# Patient Record
Sex: Female | Born: 1950 | Race: Black or African American | Hispanic: No | Marital: Single
Health system: Southern US, Community
[De-identification: ages and names within clinical notes are randomized; demographics above are authoritative.]

---

## 2013-03-30 ENCOUNTER — Inpatient Hospital Stay: Payer: Self-pay | Admitting: Internal Medicine

## 2013-03-30 LAB — URINALYSIS, COMPLETE
Bacteria: NONE SEEN
Blood: NEGATIVE
Glucose,UR: NEGATIVE mg/dL (ref 0–75)
Ketone: NEGATIVE
Leukocyte Esterase: NEGATIVE
Nitrite: NEGATIVE
Protein: 30
RBC,UR: 3 /HPF (ref 0–5)
Squamous Epithelial: 3

## 2013-03-30 LAB — CBC
HGB: 11.9 g/dL — ABNORMAL LOW (ref 12.0–16.0)
MCH: 31.8 pg (ref 26.0–34.0)
MCV: 93 fL (ref 80–100)
RBC: 3.73 10*6/uL — ABNORMAL LOW (ref 3.80–5.20)
RDW: 16.8 % — ABNORMAL HIGH (ref 11.5–14.5)

## 2013-03-30 LAB — CK TOTAL AND CKMB (NOT AT ARMC): CK-MB: 0.8 ng/mL (ref 0.5–3.6)

## 2013-03-30 LAB — COMPREHENSIVE METABOLIC PANEL
Albumin: 4.2 g/dL (ref 3.4–5.0)
Alkaline Phosphatase: 108 U/L (ref 50–136)
Anion Gap: 7 (ref 7–16)
BUN: 20 mg/dL — ABNORMAL HIGH (ref 7–18)
Bilirubin,Total: 0.5 mg/dL (ref 0.2–1.0)
Calcium, Total: 9.2 mg/dL (ref 8.5–10.1)
Co2: 30 mmol/L (ref 21–32)
Creatinine: 2.5 mg/dL — ABNORMAL HIGH (ref 0.60–1.30)
EGFR (African American): 23 — ABNORMAL LOW
EGFR (Non-African Amer.): 20 — ABNORMAL LOW
Glucose: 102 mg/dL — ABNORMAL HIGH (ref 65–99)
SGOT(AST): 131 U/L — ABNORMAL HIGH (ref 15–37)
SGPT (ALT): 43 U/L (ref 12–78)
Total Protein: 7.4 g/dL (ref 6.4–8.2)

## 2013-03-30 LAB — TROPONIN I
Troponin-I: 0.04 ng/mL
Troponin-I: 0.03 ng/mL

## 2013-03-31 LAB — CBC WITH DIFFERENTIAL/PLATELET
Basophil %: 0.7 %
Eosinophil %: 2.2 %
HCT: 34.6 % — ABNORMAL LOW (ref 35.0–47.0)
HGB: 11.6 g/dL — ABNORMAL LOW (ref 12.0–16.0)
Lymphocyte #: 1.8 10*3/uL (ref 1.0–3.6)
Lymphocyte %: 30.9 %
MCH: 31.7 pg (ref 26.0–34.0)
MCHC: 33.5 g/dL (ref 32.0–36.0)
MCV: 95 fL (ref 80–100)
RBC: 3.66 10*6/uL — ABNORMAL LOW (ref 3.80–5.20)
RDW: 16.9 % — ABNORMAL HIGH (ref 11.5–14.5)
WBC: 5.7 10*3/uL (ref 3.6–11.0)

## 2013-03-31 LAB — BASIC METABOLIC PANEL
BUN: 17 mg/dL (ref 7–18)
Chloride: 102 mmol/L (ref 98–107)
Creatinine: 1.05 mg/dL (ref 0.60–1.30)
EGFR (African American): >60
Glucose: 100 mg/dL — ABNORMAL HIGH (ref 65–99)
Potassium: 3 mmol/L — ABNORMAL LOW (ref 3.5–5.1)
Sodium: 137 mmol/L (ref 136–145)

## 2013-03-31 LAB — MAGNESIUM: Magnesium: 1.2 mg/dL — ABNORMAL LOW

## 2013-04-01 LAB — BASIC METABOLIC PANEL
BUN: 14 mg/dL (ref 7–18)
Calcium, Total: 9.3 mg/dL (ref 8.5–10.1)
EGFR (African American): >60
EGFR (Non-African Amer.): >60
Glucose: 87 mg/dL (ref 65–99)
Osmolality: 281 (ref 275–301)
Potassium: 3.3 mmol/L — ABNORMAL LOW (ref 3.5–5.1)
Sodium: 141 mmol/L (ref 136–145)

## 2013-04-01 LAB — PROTEIN ELECTROPHORESIS(ARMC)

## 2013-09-17 IMAGING — CT CT HEAD WITHOUT CONTRAST
2 series · 16 of 30 positions shown, 20 images · non-contrast
Comparison: none

REASON FOR EXAM: unwitnessed fall
COMMENTS:

[Series 2: without · axial · non-contrast · 0.44mm/px · z∈[+1196,+1330]mm · 13 of 37 slices shown, 17 images]
[im 3/37  brain]
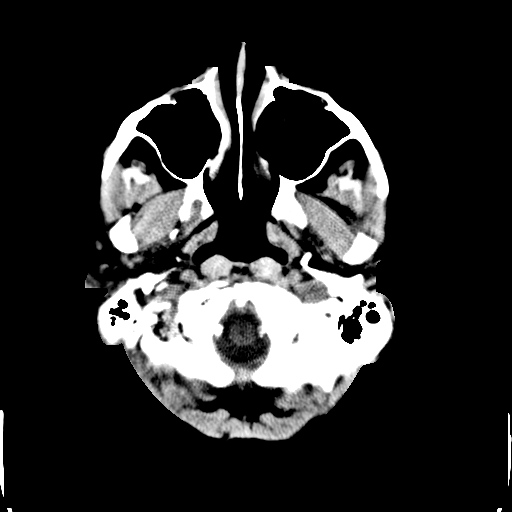
[im 3/37  bone]
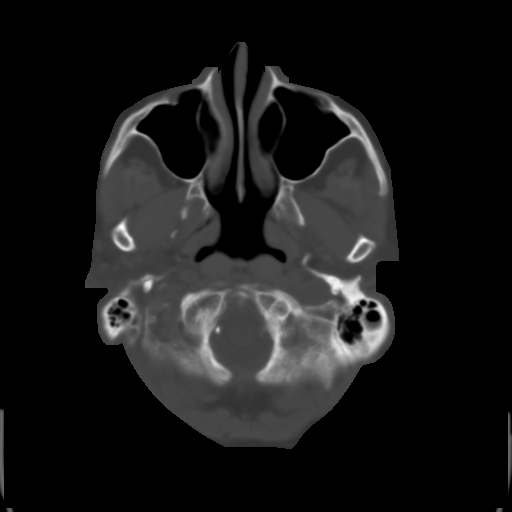
[im 6/37  brain]
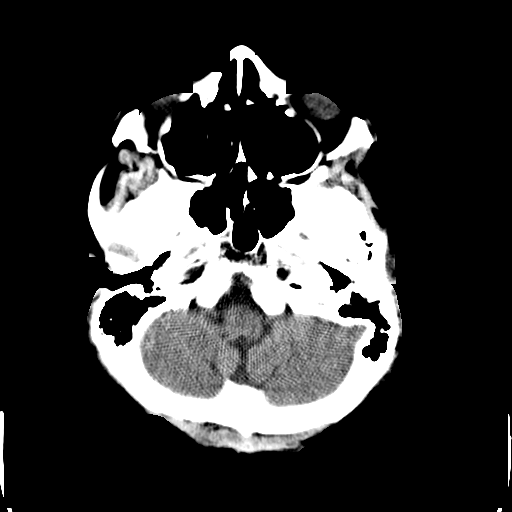
[im 8/37  brain]
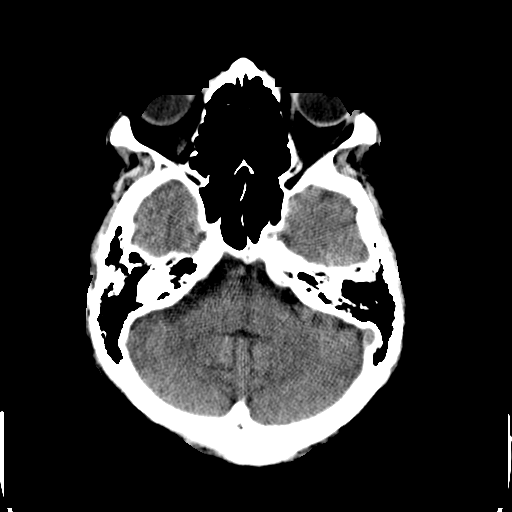
[im 11/37  brain]
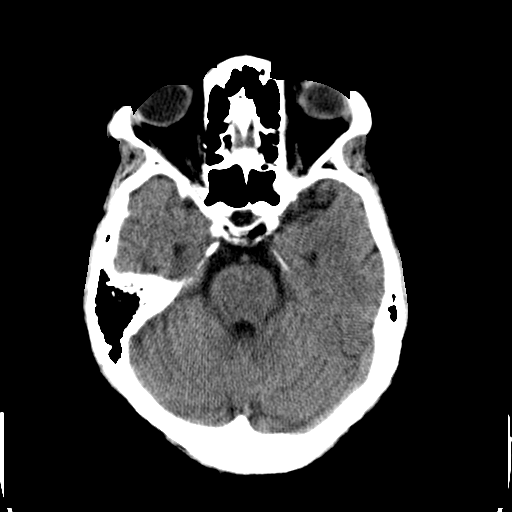
[im 13/37  brain]
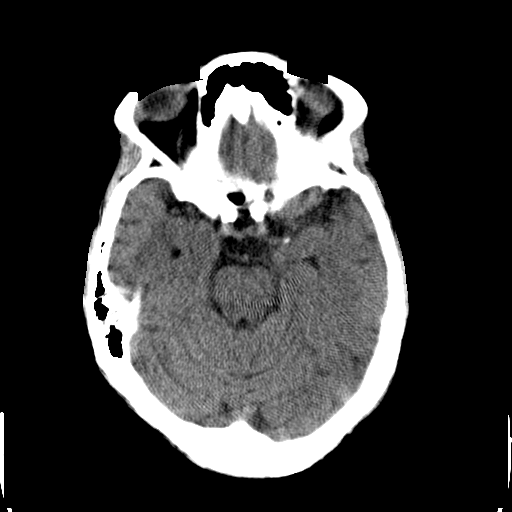
[im 13/37  bone]
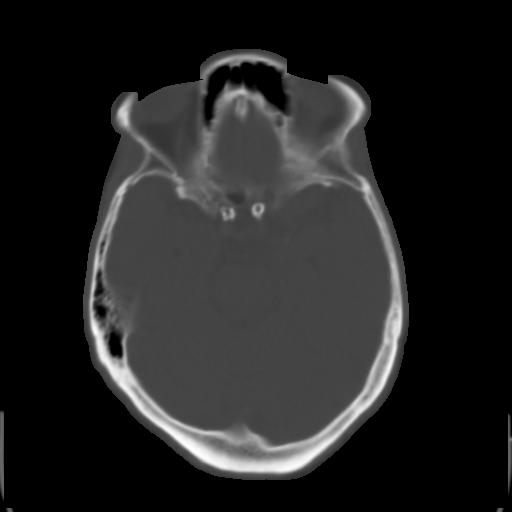
[im 16/37  brain]
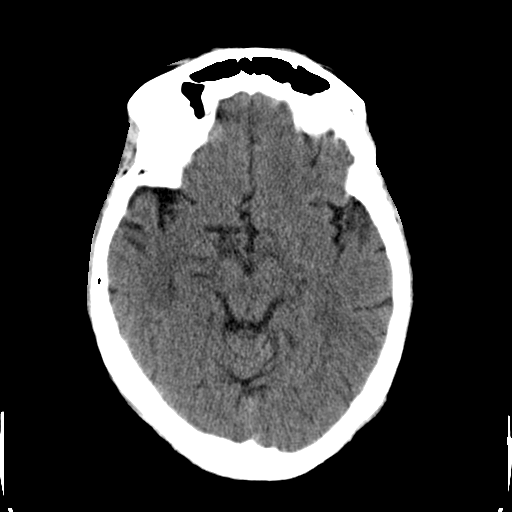
[im 19/37  brain]
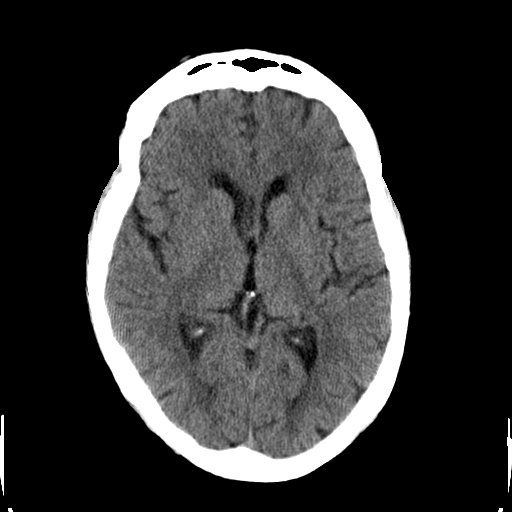
[im 21/37  brain]
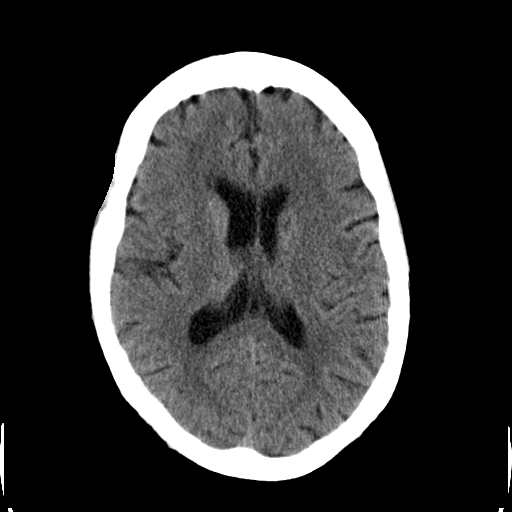
[im 24/37  brain]
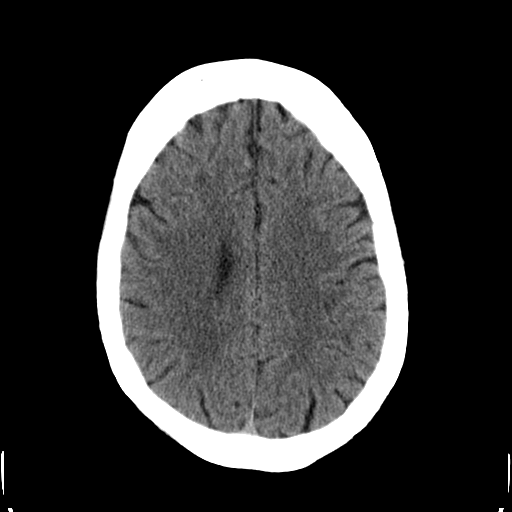
[im 24/37  bone]
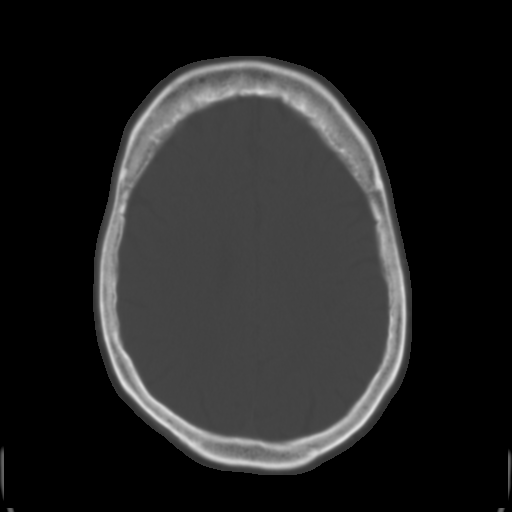
[im 26/37  brain]
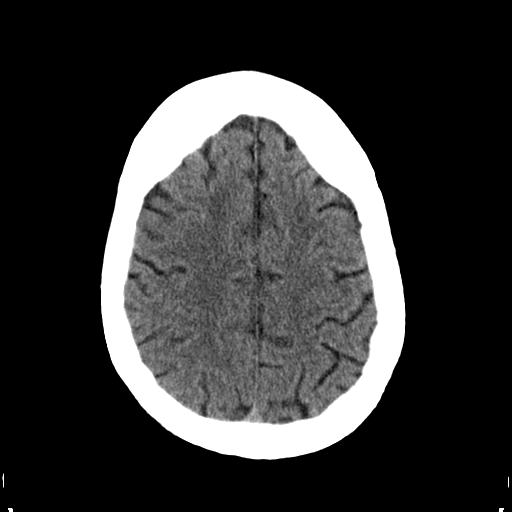
[im 29/37  brain]
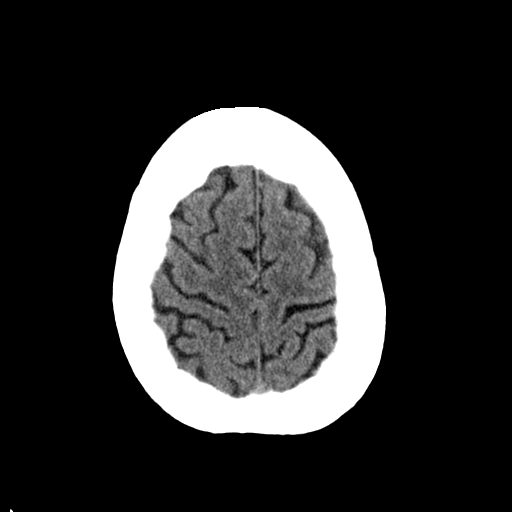
[im 31/37  brain]
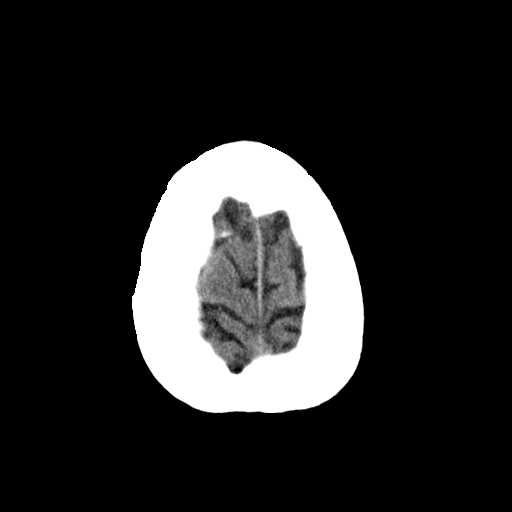
[im 34/37  brain]
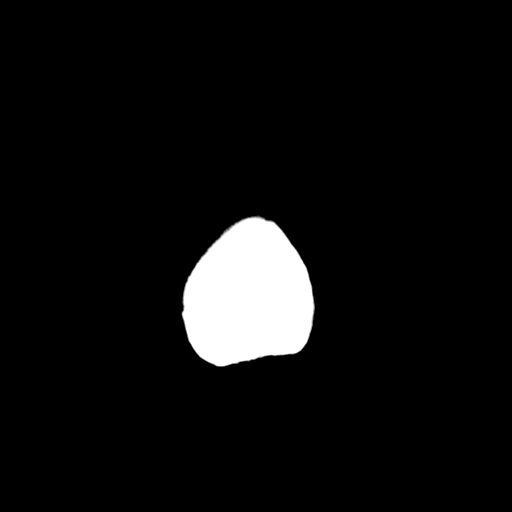
[im 34/37  bone]
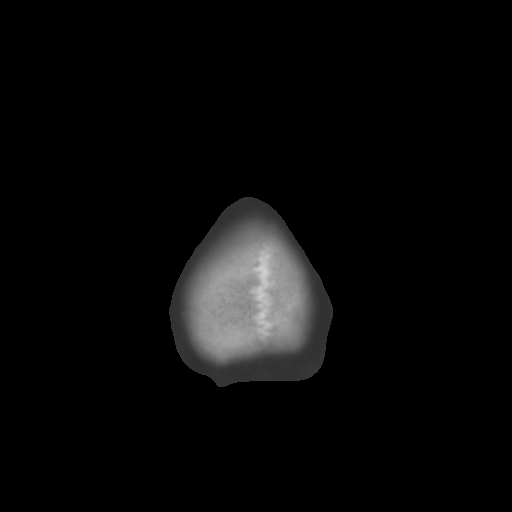

[Series 3: bone · axial · 0.44mm/px · z∈[+1196,+1236]mm · 3 of 37 slices shown]
[im 3/37  bone]
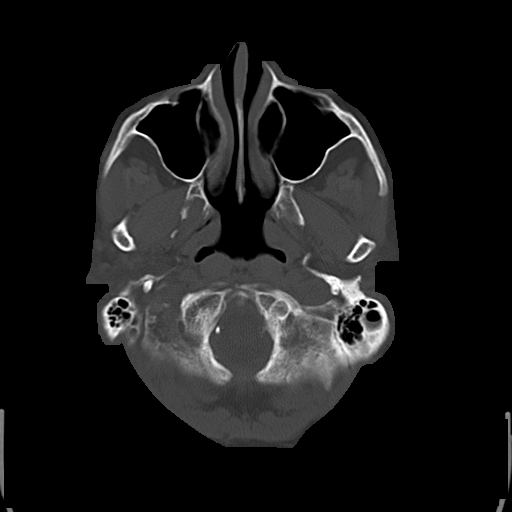
[im 8/37  bone]
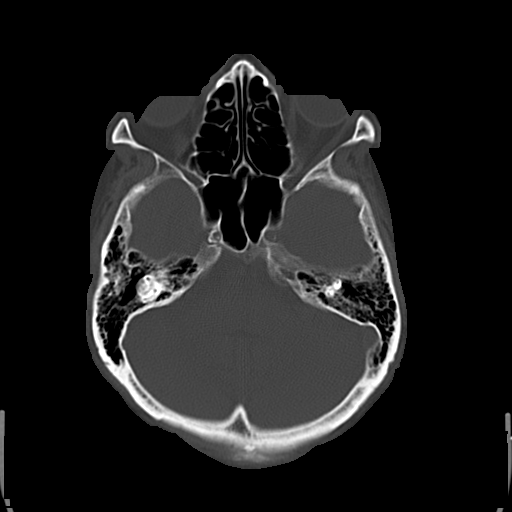
[im 13/37  bone]
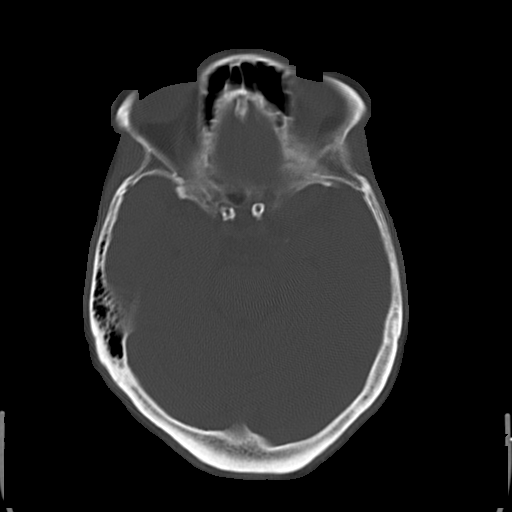

[16 of 30 positions shown; findings below may reference images not displayed]

PROCEDURE:     CT  - CT HEAD WITHOUT CONTRAST  - March 30, 2013  [DATE]

RESULT:     Noncontrast CT of the brain is performed. There is scattered
low-attenuation in the periventricular and subcortical white matter regions.
The ventricles and sulci appear within normal limits. There is no
intracranial hemorrhage, mass effect or underlying mass. No evolving infarct
is evident. The sinuses and mastoid air cells show normal aeration. The
calvarium is intact.
IMPRESSION: 1. No acute intracranial abnormality.

[REDACTED]

## 2013-10-16 ENCOUNTER — Inpatient Hospital Stay: Payer: Self-pay | Admitting: Internal Medicine

## 2013-10-16 LAB — PRO B NATRIURETIC PEPTIDE: B-Type Natriuretic Peptide: 12727 pg/mL — ABNORMAL HIGH (ref 0–125)

## 2013-10-16 LAB — COMPREHENSIVE METABOLIC PANEL
ALT: 52 U/L (ref 12–78)
Albumin: 3.7 g/dL (ref 3.4–5.0)
Alkaline Phosphatase: 247 U/L — ABNORMAL HIGH
Anion Gap: 16 (ref 7–16)
BUN: 13 mg/dL (ref 7–18)
Bilirubin,Total: 1.7 mg/dL — ABNORMAL HIGH (ref 0.2–1.0)
Calcium, Total: 8.2 mg/dL — ABNORMAL LOW (ref 8.5–10.1)
Chloride: 113 mmol/L — ABNORMAL HIGH (ref 98–107)
Co2: 15 mmol/L — ABNORMAL LOW (ref 21–32)
Creatinine: 0.99 mg/dL (ref 0.60–1.30)
GLUCOSE: 108 mg/dL — AB (ref 65–99)
Osmolality: 287 (ref 275–301)
Potassium: 3.4 mmol/L — ABNORMAL LOW (ref 3.5–5.1)
SGOT(AST): 63 U/L — ABNORMAL HIGH (ref 15–37)
SODIUM: 144 mmol/L (ref 136–145)
TOTAL PROTEIN: 8.1 g/dL (ref 6.4–8.2)

## 2013-10-16 LAB — CBC
HCT: 41.2 % (ref 35.0–47.0)
HGB: 12.8 g/dL (ref 12.0–16.0)
MCH: 25.3 pg — AB (ref 26.0–34.0)
MCHC: 31 g/dL — AB (ref 32.0–36.0)
MCV: 82 fL (ref 80–100)
PLATELETS: 225 10*3/uL (ref 150–440)
RBC: 5.05 10*6/uL (ref 3.80–5.20)
RDW: 23 % — ABNORMAL HIGH (ref 11.5–14.5)
WBC: 9.8 10*3/uL (ref 3.6–11.0)

## 2013-10-16 LAB — URINALYSIS, COMPLETE
BILIRUBIN, UR: NEGATIVE
Bacteria: NONE SEEN
Blood: NEGATIVE
Hyaline Cast: 5
Ketone: NEGATIVE
Leukocyte Esterase: NEGATIVE
Nitrite: NEGATIVE
PH: 5 (ref 4.5–8.0)
SQUAMOUS EPITHELIAL: NONE SEEN
Specific Gravity: 1.017 (ref 1.003–1.030)

## 2013-10-16 LAB — TSH: Thyroid Stimulating Horm: 1.24 u[IU]/mL

## 2013-10-16 LAB — MAGNESIUM: Magnesium: 1.5 mg/dL — ABNORMAL LOW

## 2013-10-16 LAB — PROTIME-INR
INR: 1.3
PROTHROMBIN TIME: 16 s — AB (ref 11.5–14.7)

## 2013-10-16 LAB — TROPONIN I: Troponin-I: 0.25 ng/mL — ABNORMAL HIGH

## 2013-10-16 LAB — CK TOTAL AND CKMB (NOT AT ARMC)
CK, TOTAL: 171 U/L
CK-MB: 3.7 ng/mL — AB (ref 0.5–3.6)

## 2013-10-17 LAB — CBC WITH DIFFERENTIAL/PLATELET
BASOS ABS: 0 10*3/uL (ref 0.0–0.1)
Basophil %: 0.1 %
EOS ABS: 0 10*3/uL (ref 0.0–0.7)
Eosinophil %: 0 %
HCT: 36.4 % (ref 35.0–47.0)
HGB: 11.4 g/dL — ABNORMAL LOW (ref 12.0–16.0)
Lymphocyte #: 0.4 10*3/uL — ABNORMAL LOW (ref 1.0–3.6)
Lymphocyte %: 2.1 %
MCH: 24.8 pg — AB (ref 26.0–34.0)
MCHC: 31.2 g/dL — ABNORMAL LOW (ref 32.0–36.0)
MCV: 80 fL (ref 80–100)
MONO ABS: 0.2 x10 3/mm (ref 0.2–0.9)
MONOS PCT: 0.8 %
NEUTROS PCT: 97 %
Neutrophil #: 20.2 10*3/uL — ABNORMAL HIGH (ref 1.4–6.5)
Platelet: 187 10*3/uL (ref 150–440)
RBC: 4.58 10*6/uL (ref 3.80–5.20)
RDW: 22.6 % — AB (ref 11.5–14.5)
WBC: 20.8 10*3/uL — AB (ref 3.6–11.0)

## 2013-10-17 LAB — BASIC METABOLIC PANEL
ANION GAP: 4 — AB (ref 7–16)
BUN: 20 mg/dL — ABNORMAL HIGH (ref 7–18)
CALCIUM: 7.8 mg/dL — AB (ref 8.5–10.1)
CHLORIDE: 108 mmol/L — AB (ref 98–107)
CREATININE: 1.12 mg/dL (ref 0.60–1.30)
Co2: 29 mmol/L (ref 21–32)
GFR CALC NON AF AMER: 53 — AB
GLUCOSE: 141 mg/dL — AB (ref 65–99)
OSMOLALITY: 286 (ref 275–301)
POTASSIUM: 4.3 mmol/L (ref 3.5–5.1)
Sodium: 141 mmol/L (ref 136–145)

## 2013-10-17 LAB — CK-MB
CK-MB: 2.9 ng/mL (ref 0.5–3.6)
CK-MB: 2.4 ng/mL (ref 0.5–3.6)
CK-MB: 2.3 ng/mL (ref 0.5–3.6)

## 2013-10-17 LAB — LIPID PANEL
Cholesterol: 162 mg/dL (ref 0–200)
HDL: 48 mg/dL (ref 40–60)
LDL CHOLESTEROL, CALC: 105 mg/dL — AB (ref 0–100)
Triglycerides: 46 mg/dL (ref 0–200)
VLDL Cholesterol, Calc: 9 mg/dL (ref 5–40)

## 2013-10-17 LAB — TSH: THYROID STIMULATING HORM: 1.61 u[IU]/mL

## 2013-10-17 LAB — MAGNESIUM: Magnesium: 1.6 mg/dL — ABNORMAL LOW

## 2013-10-17 LAB — TROPONIN I
Troponin-I: 0.47 ng/mL — ABNORMAL HIGH
Troponin-I: 0.4 ng/mL — ABNORMAL HIGH
Troponin-I: 0.32 ng/mL — ABNORMAL HIGH

## 2013-10-18 LAB — CBC WITH DIFFERENTIAL/PLATELET
BASOS PCT: 0 %
Basophil #: 0 10*3/uL (ref 0.0–0.1)
Eosinophil #: 0 10*3/uL (ref 0.0–0.7)
Eosinophil %: 0 %
HCT: 36.3 % (ref 35.0–47.0)
HGB: 11.5 g/dL — AB (ref 12.0–16.0)
Lymphocyte #: 0.3 10*3/uL — ABNORMAL LOW (ref 1.0–3.6)
Lymphocyte %: 1.5 %
MCH: 25.3 pg — AB (ref 26.0–34.0)
MCHC: 31.8 g/dL — ABNORMAL LOW (ref 32.0–36.0)
MCV: 79 fL — ABNORMAL LOW (ref 80–100)
MONO ABS: 0.1 x10 3/mm — AB (ref 0.2–0.9)
MONOS PCT: 0.5 %
NEUTROS ABS: 16.4 10*3/uL — AB (ref 1.4–6.5)
Neutrophil %: 98 %
Platelet: 159 10*3/uL (ref 150–440)
RBC: 4.57 10*6/uL (ref 3.80–5.20)
RDW: 23.1 % — ABNORMAL HIGH (ref 11.5–14.5)
WBC: 16.8 10*3/uL — ABNORMAL HIGH (ref 3.6–11.0)

## 2013-10-18 LAB — COMPREHENSIVE METABOLIC PANEL
ALBUMIN: 3.1 g/dL — AB (ref 3.4–5.0)
ALK PHOS: 160 U/L — AB
AST: 30 U/L (ref 15–37)
Anion Gap: 9 (ref 7–16)
BUN: 31 mg/dL — AB (ref 7–18)
Bilirubin,Total: 0.8 mg/dL (ref 0.2–1.0)
CREATININE: 1.51 mg/dL — AB (ref 0.60–1.30)
Calcium, Total: 8 mg/dL — ABNORMAL LOW (ref 8.5–10.1)
Chloride: 103 mmol/L (ref 98–107)
Co2: 23 mmol/L (ref 21–32)
EGFR (Non-African Amer.): 37 — ABNORMAL LOW
GFR CALC AF AMER: 43 — AB
Glucose: 153 mg/dL — ABNORMAL HIGH (ref 65–99)
Osmolality: 280 (ref 275–301)
POTASSIUM: 3.8 mmol/L (ref 3.5–5.1)
SGPT (ALT): 32 U/L (ref 12–78)
SODIUM: 135 mmol/L — AB (ref 136–145)
Total Protein: 6.6 g/dL (ref 6.4–8.2)

## 2013-10-18 LAB — BASIC METABOLIC PANEL
ANION GAP: 9 (ref 7–16)
BUN: 33 mg/dL — AB (ref 7–18)
CALCIUM: 7.9 mg/dL — AB (ref 8.5–10.1)
CHLORIDE: 101 mmol/L (ref 98–107)
CREATININE: 1.63 mg/dL — AB (ref 0.60–1.30)
Co2: 25 mmol/L (ref 21–32)
EGFR (African American): 39 — ABNORMAL LOW
EGFR (Non-African Amer.): 34 — ABNORMAL LOW
Glucose: 157 mg/dL — ABNORMAL HIGH (ref 65–99)
OSMOLALITY: 281 (ref 275–301)
POTASSIUM: 3.8 mmol/L (ref 3.5–5.1)
Sodium: 135 mmol/L — ABNORMAL LOW (ref 136–145)

## 2013-10-18 LAB — MAGNESIUM
MAGNESIUM: 2.1 mg/dL
MAGNESIUM: 1.9 mg/dL

## 2013-10-19 LAB — BASIC METABOLIC PANEL
ANION GAP: 9 (ref 7–16)
Anion Gap: 9 (ref 7–16)
BUN: 36 mg/dL — ABNORMAL HIGH (ref 7–18)
BUN: 38 mg/dL — ABNORMAL HIGH (ref 7–18)
CHLORIDE: 102 mmol/L (ref 98–107)
CHLORIDE: 102 mmol/L (ref 98–107)
Calcium, Total: 8 mg/dL — ABNORMAL LOW (ref 8.5–10.1)
Calcium, Total: 8.1 mg/dL — ABNORMAL LOW (ref 8.5–10.1)
Co2: 24 mmol/L (ref 21–32)
Co2: 22 mmol/L (ref 21–32)
Creatinine: 1.59 mg/dL — ABNORMAL HIGH (ref 0.60–1.30)
Creatinine: 1.67 mg/dL — ABNORMAL HIGH (ref 0.60–1.30)
EGFR (African American): 38 — ABNORMAL LOW
EGFR (Non-African Amer.): 35 — ABNORMAL LOW
EGFR (Non-African Amer.): 33 — ABNORMAL LOW
GFR CALC AF AMER: 40 — AB
GLUCOSE: 131 mg/dL — AB (ref 65–99)
Glucose: 144 mg/dL — ABNORMAL HIGH (ref 65–99)
OSMOLALITY: 281 (ref 275–301)
Osmolality: 277 (ref 275–301)
POTASSIUM: 4.6 mmol/L (ref 3.5–5.1)
Potassium: 4.1 mmol/L (ref 3.5–5.1)
SODIUM: 133 mmol/L — AB (ref 136–145)
Sodium: 135 mmol/L — ABNORMAL LOW (ref 136–145)

## 2013-10-19 LAB — CBC WITH DIFFERENTIAL/PLATELET
BASOS PCT: 0.1 %
Basophil #: 0 10*3/uL (ref 0.0–0.1)
Eosinophil #: 0 10*3/uL (ref 0.0–0.7)
Eosinophil %: 0 %
HCT: 35.8 % (ref 35.0–47.0)
HGB: 11.5 g/dL — ABNORMAL LOW (ref 12.0–16.0)
Lymphocyte #: 0.2 10*3/uL — ABNORMAL LOW (ref 1.0–3.6)
Lymphocyte %: 1.8 %
MCH: 25.5 pg — ABNORMAL LOW (ref 26.0–34.0)
MCHC: 32.1 g/dL (ref 32.0–36.0)
MCV: 79 fL — AB (ref 80–100)
MONO ABS: 0.1 x10 3/mm — AB (ref 0.2–0.9)
MONOS PCT: 1.1 %
Neutrophil #: 10 10*3/uL — ABNORMAL HIGH (ref 1.4–6.5)
Neutrophil %: 97 %
PLATELETS: 150 10*3/uL (ref 150–440)
RBC: 4.52 10*6/uL (ref 3.80–5.20)
RDW: 22.7 % — ABNORMAL HIGH (ref 11.5–14.5)
WBC: 10.3 10*3/uL (ref 3.6–11.0)

## 2013-10-19 LAB — MAGNESIUM: MAGNESIUM: 2 mg/dL

## 2013-10-20 LAB — BASIC METABOLIC PANEL
Anion Gap: 8 (ref 7–16)
BUN: 41 mg/dL — ABNORMAL HIGH (ref 7–18)
CO2: 24 mmol/L (ref 21–32)
Calcium, Total: 8.1 mg/dL — ABNORMAL LOW (ref 8.5–10.1)
Chloride: 102 mmol/L (ref 98–107)
Creatinine: 1.55 mg/dL — ABNORMAL HIGH (ref 0.60–1.30)
EGFR (African American): 41 — ABNORMAL LOW
GFR CALC NON AF AMER: 36 — AB
Glucose: 133 mg/dL — ABNORMAL HIGH (ref 65–99)
Osmolality: 280 (ref 275–301)
Potassium: 4.4 mmol/L (ref 3.5–5.1)
Sodium: 134 mmol/L — ABNORMAL LOW (ref 136–145)

## 2013-10-20 LAB — MAGNESIUM: MAGNESIUM: 2 mg/dL

## 2013-10-21 LAB — EXPECTORATED SPUTUM ASSESSMENT W GRAM STAIN, RFLX TO RESP C

## 2013-10-21 LAB — CULTURE, BLOOD (SINGLE)

## 2013-11-25 ENCOUNTER — Ambulatory Visit: Payer: Self-pay | Admitting: Internal Medicine

## 2013-11-28 ENCOUNTER — Inpatient Hospital Stay: Payer: Self-pay | Admitting: Internal Medicine

## 2013-11-28 LAB — CBC
HCT: 48.1 % — AB (ref 35.0–47.0)
HGB: 14.5 g/dL (ref 12.0–16.0)
MCH: 25.8 pg — AB (ref 26.0–34.0)
MCHC: 30.1 g/dL — ABNORMAL LOW (ref 32.0–36.0)
MCV: 86 fL (ref 80–100)
PLATELETS: 119 10*3/uL — AB (ref 150–440)
RBC: 5.62 10*6/uL — AB (ref 3.80–5.20)
RDW: 27.1 % — ABNORMAL HIGH (ref 11.5–14.5)
WBC: 7.2 10*3/uL (ref 3.6–11.0)

## 2013-11-28 LAB — COMPREHENSIVE METABOLIC PANEL
ALT: 75 U/L (ref 12–78)
ANION GAP: 24 — AB (ref 7–16)
AST: 238 U/L — AB (ref 15–37)
Albumin: 4 g/dL (ref 3.4–5.0)
Alkaline Phosphatase: 246 U/L — ABNORMAL HIGH
BUN: 8 mg/dL (ref 7–18)
Bilirubin,Total: 1.3 mg/dL — ABNORMAL HIGH (ref 0.2–1.0)
CALCIUM: 9.6 mg/dL (ref 8.5–10.1)
CO2: 15 mmol/L — AB (ref 21–32)
CREATININE: 0.95 mg/dL (ref 0.60–1.30)
Chloride: 108 mmol/L — ABNORMAL HIGH (ref 98–107)
Glucose: 57 mg/dL — ABNORMAL LOW (ref 65–99)
OSMOLALITY: 288 (ref 275–301)
POTASSIUM: 3.2 mmol/L — AB (ref 3.5–5.1)
Sodium: 147 mmol/L — ABNORMAL HIGH (ref 136–145)
TOTAL PROTEIN: 8.5 g/dL — AB (ref 6.4–8.2)

## 2013-11-28 LAB — CK-MB
CK-MB: 32.2 ng/mL — AB (ref 0.5–3.6)
CK-MB: 134 ng/mL — AB (ref 0.5–3.6)
CK-MB: 142.6 ng/mL — ABNORMAL HIGH (ref 0.5–3.6)

## 2013-11-28 LAB — MAGNESIUM: MAGNESIUM: 1.3 mg/dL — AB

## 2013-11-28 LAB — TROPONIN I
TROPONIN-I: 1.3 ng/mL — AB
TROPONIN-I: 14 ng/mL — AB
Troponin-I: 12 ng/mL — ABNORMAL HIGH

## 2013-11-28 LAB — APTT: ACTIVATED PTT: 140.8 s — AB (ref 23.6–35.9)

## 2013-11-29 LAB — CBC WITH DIFFERENTIAL/PLATELET
Basophil #: 0 10*3/uL (ref 0.0–0.1)
Basophil %: 0 %
Eosinophil #: 0 10*3/uL (ref 0.0–0.7)
Eosinophil %: 0 %
HCT: 43.4 % (ref 35.0–47.0)
HGB: 13.5 g/dL (ref 12.0–16.0)
Lymphocyte #: 0.4 10*3/uL — ABNORMAL LOW (ref 1.0–3.6)
Lymphocyte %: 2.7 %
MCH: 25.6 pg — ABNORMAL LOW (ref 26.0–34.0)
MCHC: 31 g/dL — ABNORMAL LOW (ref 32.0–36.0)
MCV: 83 fL (ref 80–100)
Monocyte #: 0.2 x10 3/mm (ref 0.2–0.9)
Monocyte %: 1.3 %
Neutrophil #: 15.4 10*3/uL — ABNORMAL HIGH (ref 1.4–6.5)
Neutrophil %: 96 %
Platelet: 96 10*3/uL — ABNORMAL LOW (ref 150–440)
RBC: 5.26 10*6/uL — ABNORMAL HIGH (ref 3.80–5.20)
RDW: 26.9 % — ABNORMAL HIGH (ref 11.5–14.5)
WBC: 16 10*3/uL — ABNORMAL HIGH (ref 3.6–11.0)

## 2013-11-29 LAB — BASIC METABOLIC PANEL
Anion Gap: 14 (ref 7–16)
BUN: 16 mg/dL (ref 7–18)
CALCIUM: 8.1 mg/dL — AB (ref 8.5–10.1)
CO2: 22 mmol/L (ref 21–32)
CREATININE: 1.24 mg/dL (ref 0.60–1.30)
Chloride: 108 mmol/L — ABNORMAL HIGH (ref 98–107)
GFR CALC AF AMER: 54 — AB
GFR CALC NON AF AMER: 47 — AB
Glucose: 260 mg/dL — ABNORMAL HIGH (ref 65–99)
OSMOLALITY: 297 (ref 275–301)
Potassium: 3.7 mmol/L (ref 3.5–5.1)
SODIUM: 144 mmol/L (ref 136–145)

## 2013-11-29 LAB — LIPID PANEL
Cholesterol: 282 mg/dL — ABNORMAL HIGH (ref 0–200)
HDL Cholesterol: 145 mg/dL — ABNORMAL HIGH (ref 40–60)
Ldl Cholesterol, Calc: 121 mg/dL — ABNORMAL HIGH (ref 0–100)
Triglycerides: 82 mg/dL (ref 0–200)
VLDL Cholesterol, Calc: 16 mg/dL (ref 5–40)

## 2013-11-29 LAB — MAGNESIUM
Magnesium: 1.2 mg/dL — ABNORMAL LOW
Magnesium: 1.8 mg/dL

## 2013-11-29 LAB — APTT: Activated PTT: 95.1 secs — ABNORMAL HIGH (ref 23.6–35.9)

## 2013-11-30 LAB — MAGNESIUM: Magnesium: 1.6 mg/dL — ABNORMAL LOW

## 2013-11-30 LAB — BASIC METABOLIC PANEL
ANION GAP: 6 — AB (ref 7–16)
BUN: 21 mg/dL — AB (ref 7–18)
Calcium, Total: 8.2 mg/dL — ABNORMAL LOW (ref 8.5–10.1)
Chloride: 106 mmol/L (ref 98–107)
Co2: 27 mmol/L (ref 21–32)
Creatinine: 1.15 mg/dL (ref 0.60–1.30)
EGFR (African American): 59 — ABNORMAL LOW
EGFR (Non-African Amer.): 51 — ABNORMAL LOW
Glucose: 112 mg/dL — ABNORMAL HIGH (ref 65–99)
OSMOLALITY: 281 (ref 275–301)
POTASSIUM: 3.6 mmol/L (ref 3.5–5.1)
Sodium: 139 mmol/L (ref 136–145)

## 2013-12-02 LAB — BASIC METABOLIC PANEL
ANION GAP: 6 — AB (ref 7–16)
BUN: 15 mg/dL (ref 7–18)
Calcium, Total: 8.7 mg/dL (ref 8.5–10.1)
Chloride: 107 mmol/L (ref 98–107)
Co2: 27 mmol/L (ref 21–32)
Creatinine: 0.87 mg/dL (ref 0.60–1.30)
EGFR (African American): >60
EGFR (Non-African Amer.): >60
GLUCOSE: 117 mg/dL — AB (ref 65–99)
Osmolality: 281 (ref 275–301)
POTASSIUM: 3.5 mmol/L (ref 3.5–5.1)
SODIUM: 140 mmol/L (ref 136–145)

## 2013-12-02 LAB — MAGNESIUM: Magnesium: 1.6 mg/dL — ABNORMAL LOW

## 2013-12-03 LAB — CULTURE, BLOOD (SINGLE)

## 2013-12-26 ENCOUNTER — Ambulatory Visit: Payer: Self-pay | Admitting: Internal Medicine

## 2014-04-06 IMAGING — CR DG CHEST 1V PORT
1 series · 1 of 1 positions shown · non-contrast
Comparison: DG CHEST 1V PORT dated 10/16/2013

CLINICAL DATA: Pneumonia, history of CHF and renal failure

EXAM:
PORTABLE CHEST - 1 VIEW

[ap]
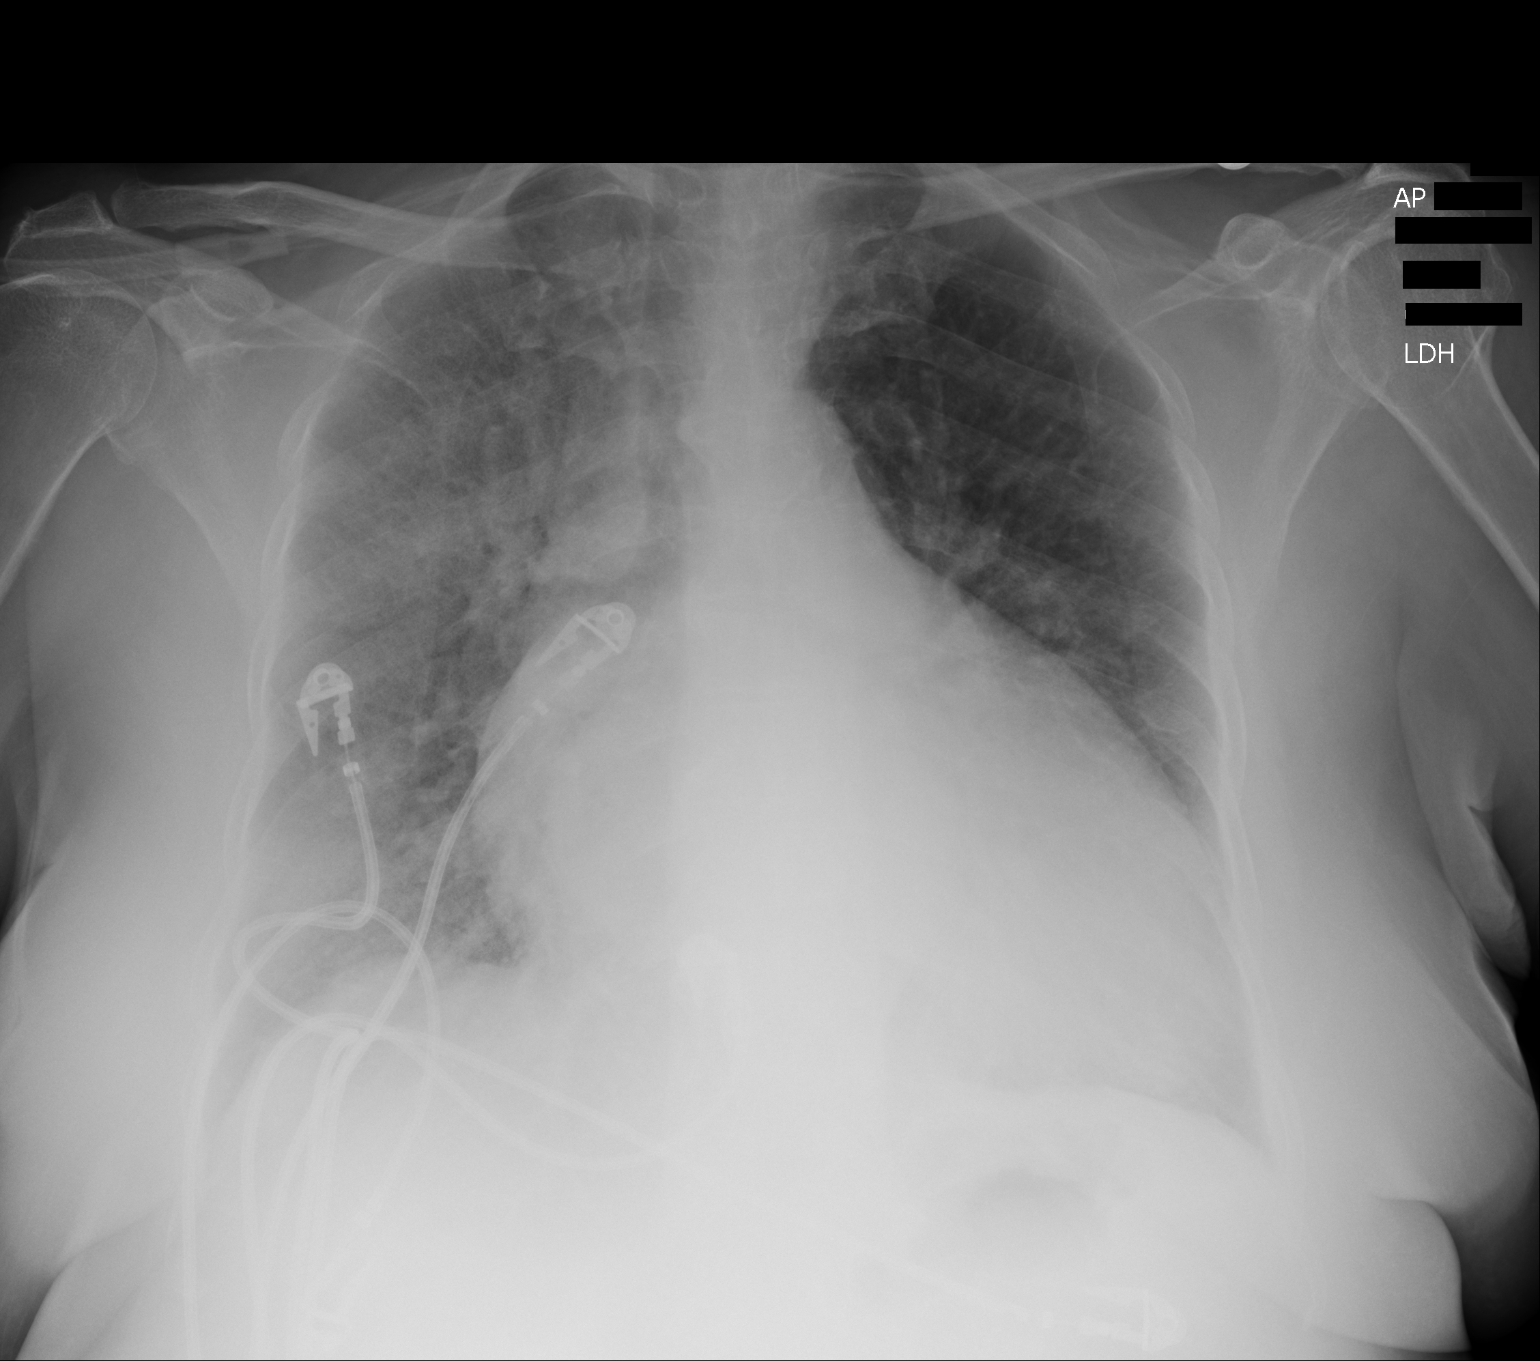

[1 of 1 positions shown; findings below may reference images not displayed]

FINDINGS: Since the previous study there has been considerable improvement in
the appearance of the right lung. The confluent alveolar densities
have largely resolved, but considerable increased interstitial
density persists throughout the right lung. On the left minimal
prominence of the interstitial markings is noted. The hemidiaphragm
and heart border are better demonstrated. The pulmonary vascularity
is more distinct. The trachea is midline. The cardiopericardial
silhouette remains enlarged. The observed portions of the bony
thorax exhibit no acute abnormalities.
IMPRESSION: The findings are consistent with improving alveolar filling
processes on the right which may reflect pneumonia or CHF. Given the
dramatic change since yesterday's study there was likely a large
component of interstitial edema present which has now decreased.
When the patient can tolerate the procedure, a PA and lateral chest
x-ray would be of value.

## 2014-04-27 DEATH — deceased

## 2014-05-18 IMAGING — CR DG CHEST 1V PORT
1 series · 1 of 1 positions shown · non-contrast
Comparison: 10/17/2013

CLINICAL DATA: Shortness of breath.  Recent pneumonia.

EXAM:
PORTABLE CHEST - 1 VIEW

[ap]
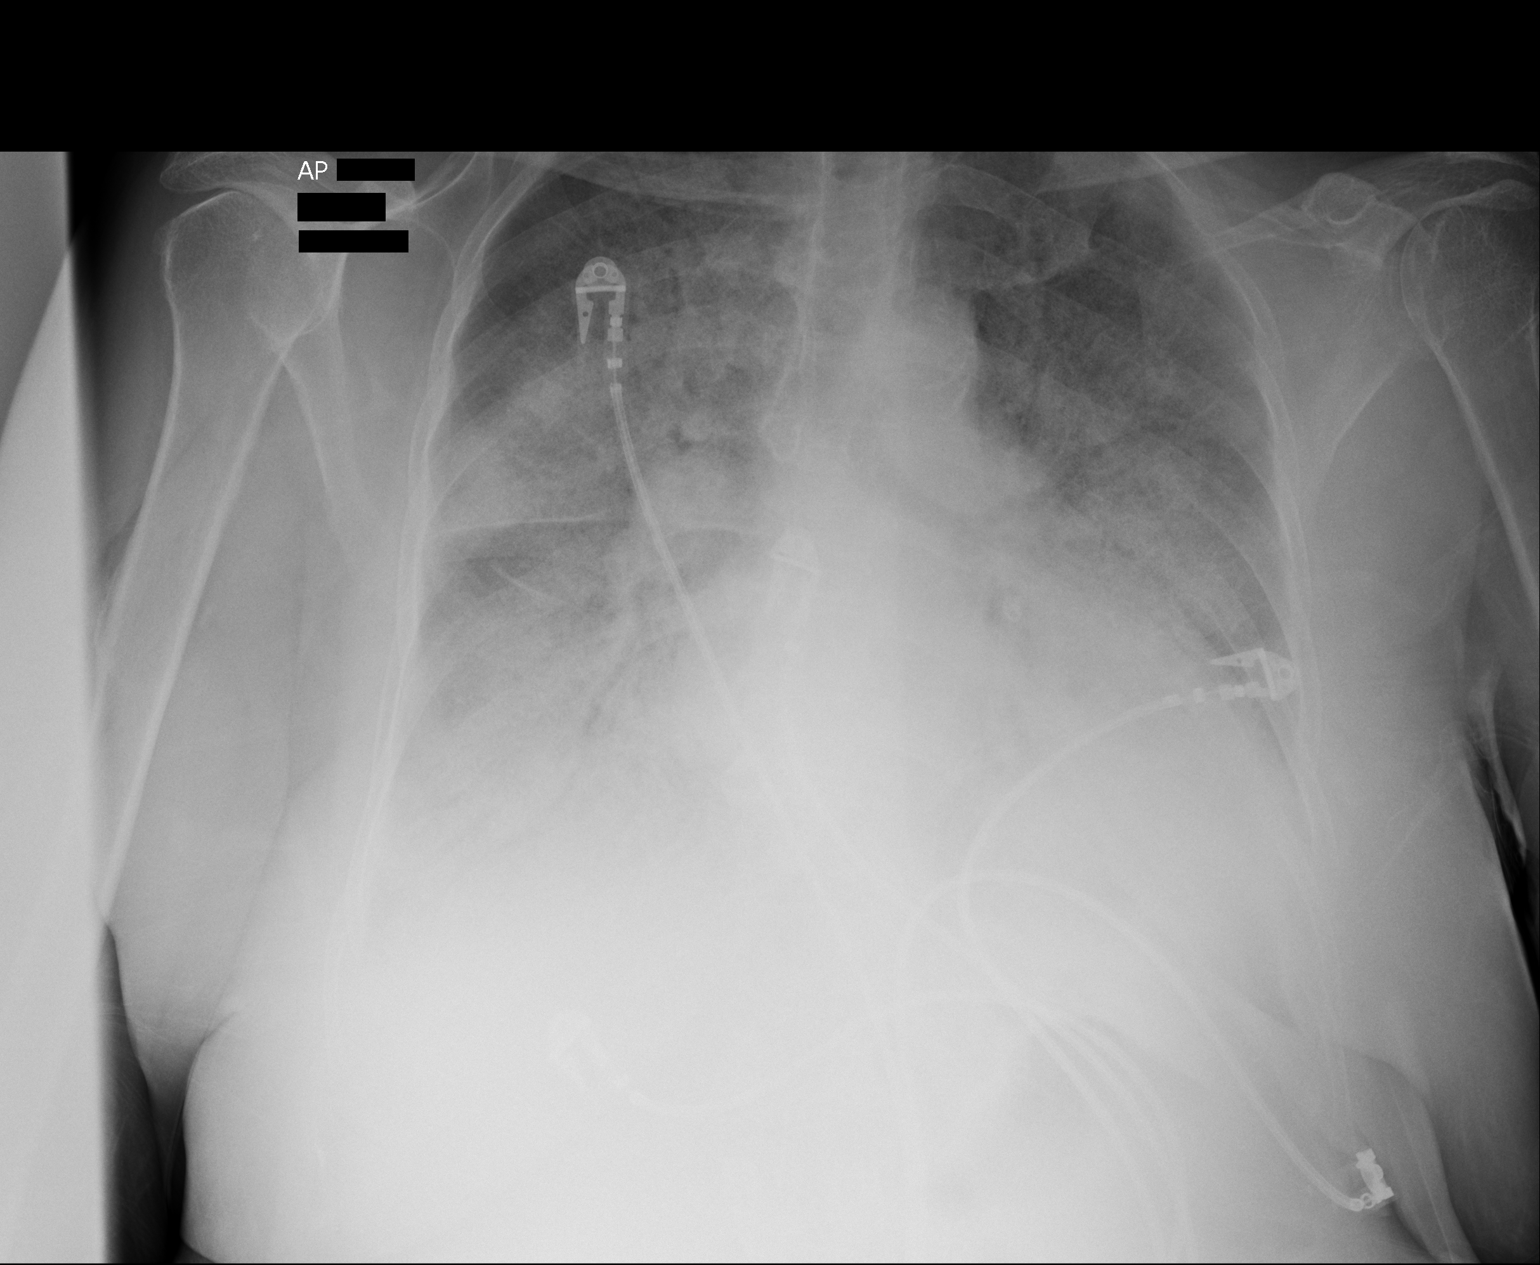

[1 of 1 positions shown; findings below may reference images not displayed]

FINDINGS: Midline trachea. Cardiomegaly accentuated by AP portable technique.
Probable small bilateral pleural effusions. No pneumothorax.
Slightly right greater than left interstitial and airspace disease.
Relative sparing of the apices. Transverse aortic atherosclerosis.
IMPRESSION: Worsening bilateral aeration with relatively diffuse interstitial
and airspace disease. Favor multi focal infection or aspiration.
Alveolar pulmonary edema could look similar.

Cardiomegaly and low lung volumes with probable small bilateral
pleural effusions.

Aortic atherosclerosis.

## 2014-11-17 NOTE — H&P (Signed)
PATIENT NAME:  [name] Rocha, [name] Rocha MR#:  [id number] DATE OF BIRTH:  11/05/1950  DATE OF ADMISSION:  03/30/2013  REFERRING PHYSICIAN: Dr. Lucrezia Europe.  PRIMARY CARE PHYSICIAN: [facility] in [place].   CHIEF COMPLAINT: Weakness; presyncope.   HISTORY OF PRESENT ILLNESS: This is a 64 year old female with a significant past medical history of coronary artery disease, status post stents x 3, hyperlipidemia, hypertension, peripheral vascular disease, who presents with complaints of generalized weakness, fatigue over the last  2 days, reports overnight she was feeling very weak going to the bathroom. She did not have anything to hold onto, so she slumped onto the floor. Denies any head trauma, any loss of consciousness, but reports she has felt so weak she had crawl back to her room where she called EMS.   The patient denies any chest pain, any shortness of breath but complains of generalized weakness, lightheadedness, dizziness, and headache and shaky movement, and feeling weak all over, but no focal deficits. In the ED the patient had CT of the head without contrast which did not show any acute findings. The patient had a basic a work-up in the ED. This is  the patient's first time in the hospital so we have no previous workup on her. The patient's EKG did show  a T wave inversion in the lateral leads. We have, as mentioned, no old EKG to compare, but she denies any chest pain.   As well, the patient was noticed to be in acute renal failure with a creatinine of 2.5. Unknown what her baseline is, but she reports she has never had any problem with her kidneys, or no history of renal failure. As well, she was noticed to have hypokalemia with a potassium level of 3, and she had thrombocytopenia with a platelet level of 101. Urinalysis was negative for leukocyte esterase and nitrites. The patient reports she has not been eating or drinking well over the last few days. Reports she is on multiple blood  pressure medications and diuresis including Lasix and hydrochlorothiazide, and prior to admission her blood pressure was on the lower side. Orthostatics were not checked initially secondary to her generalized weakness.   Hospitalist service was requested to admit the patient for further evaluation and workup of her symptoms.   PAST MEDICAL HISTORY: 1.  Congestive heart failure.  2.  Coronary artery disease status post stents x 3.  3.  Gastroesophageal reflux disease.  4.  Peripheral vascular disease.  5.  Hypertension.  6.  Hyperlipidemia.   PAST SURGICAL HISTORY: 1.  Partial hysterectomy.  2.  Cardiac stents x 3.   ALLERGIES: No known drug allergies.   HOME MEDICATIONS:  1.  Plavix 75 mg oral daily.  2.  Hydrochlorothiazide 25 mg oral daily.  3.  Tramadol 50 mg every 4 hours as needed.  4.  Lisinopril 40 mg oral daily.  5.  Isosorbide mononitrate 120 mg oral daily.  6.  Sublingual nitroglycerin, as needed.  7.  Gabapentin 300 mg oral 3 times a day.  8.  Atorvastatin 80 mg oral daily.  9.  Zetia 10 mg oral daily.  10.  Metoprolol tartrate 100 mg oral, 1-1/2 tablets oral daily.  11. Amlodipine 10 mg oral daily.  12.  Lasix 40 mg oral daily.  14.  Famotidine 20 mg p.o. b.i.d.  15.  Pentoxyfylline 400 mg oral 3 times a day.  16.  Cyclobenzaprine 10 mg oral at bedtime.  17.  Omeprazole, oral daily.  18.  Bupropion SR 150 mg oral daily.   SOCIAL HISTORY: The patient smokes occasionally 1 or 2 cigarettes over the weekend; that is about it. Occasional alcohol use. No illicit drug abuse.   FAMILY HISTORY: Significant for coronary artery disease at a young age. Her brother had a heart attack in his 7730s, currently disabled due to a motor vehicle accident.   REVIEW OF SYSTEMS: CONSTITUTIONAL: The patient denies fever or chills. Complains of generalized  weakness, fatigue. Denies weight gain, weight loss, but complains of poor appetite.  EYES: Denies blurry vision, double vision,  inflammation, glaucoma.  ENT: Denies tinnitus, ear pain, hearing loss, epistaxis or discharge.  RESPIRATORY: Denies cough, wheezing, hemoptysis, dyspnea, COPD.  CARDIOVASCULAR: Denies chest pain, orthopnea, edema, palpitations, syncope. Had generalized weakness, presyncope.  GASTROINTESTINAL: Denies nausea, vomiting, diarrhea, abdominal pain, hematemesis, melena, jaundice.  GENITOURINARY: Denies dysuria, hematuria or renal colic.  ENDOCRINE: Denies polyuria, polydipsia, heat or cold intolerance.  HEMATOLOGIC: Denies anemia, easy bruising, bleeding diatheses.  INTEGUMENTARY: Denies acne, rash or skin lesions.  MUSCULOSKELETAL: Denies any swelling, gout, arthritis, cramps.  NEUROLOGIC: Denies CVA, TIAs, seizures, headache. Complains of lightheadedness and dizziness.  PSYCHIATRIC: Denies anxiety, insomnia, substance abuse, alcohol abuse or schizophrenia.   PHYSICAL EXAMINATION: VITAL SIGNS: Temperature 98.4, pulse 76, respiratory rate 18, blood pressure 95/56, saturating 100% on room air.  GENERAL: Frail, elderly female who looks comfortable in bed, in no apparent distress.  HEENT: Head atraumatic, normocephalic. Pupils equal, reactive to light. Pink conjunctivae. Anicteric sclerae. Dry oral mucosa.  NECK: Supple. No thyromegaly. No JVD. No carotid bruits.  CHEST: Good air entry bilaterally. No wheezing, rales or rhonchi.  CARDIOVASCULAR: S1, S2 heard. No rubs, murmurs or gallops.  ABDOMEN: Soft, nontender, nondistended. Bowel sounds present.  EXTREMITIES: No edema. No clubbing. No cyanosis. Dorsalis pedis pulses were minimally diminished, but palpable. Had good capillary refill; warm. Good radial pulses.  PSYCHIATRIC: Appropriate affect. Pleasant, awake, alert x 3. Intact judgment and insight.  NEUROLOGIC: Cranial nerves grossly intact. Motor 5/5. No focal deficits.  LYMPHATICS: No cervical or supraclavicular lymphadenopathy.  SKIN: Normal skin turgor. Warm and dry.   PERTINENT LABS: Glucose  102, BUN 20, creatinine 2.5, sodium 135, potassium 3, chloride 98, CO2 30.   Troponin 0.04. ALT 43, AST 131, alkaline phosphatase 108, white blood cells 5.7, hemoglobin 11.9, hematocrit 34.9, platelets 101.   Urinalysis: Negative for leukocyte esterase and nitrites.   CT of head without contrast showing no acute intracranial process.   EKG showing normal sinus rhythm at 64 beats per minute, with inverted T wave in the lateral leads.   ASSESSMENT AND PLAN: 1.  Presyncope: This appears to be multifactorial, mainly due to dehydration and volume depletion as the patient appears to be clinically dehydrated, as well in acute renal failure. As well, she is on multiple blood pressure medications with a systolic blood pressure with decreased p.o. intake. As well, she is on multiple medications including tramadol, and they  might cause her symptoms as well, so the patient will be admitted to telemetry. Will check orthostatics. We will continue with aggressive hydration. Will replace her electrolytes, mainly her potassium. Will continue her to cycle her cardiac enzymes and hydrate, and will hold her blood pressure medication and diuresis.  2.  Acute renal failure: This is due to volume depletion and dehydration. Will hold Lasix, lisinopril, hydrochlorothiazide. We will continue with aggressive IV fluid hydration.  3.  Hypokalemia: Will replace.  4.  Thrombocytopenia: Unclear as to the patient's baseline, but has  no evidence of bleed. Will hold on starting her on chemical anticoagulation for deep vein thrombosis prophylaxis; especially she is already on Plavix and pentoxyfylline.  5.  Coronary artery disease: No chest pain. No shortness of breath. Once the patient's blood pressure improves, she can be resumed back on Imdur, lisinopril, metoprolol and meanwhile continue her on Plavix and statin.  6.  Hypertension: Blood pressure is on the lower side, so will hold all hypertensive medications.  7.   Hyperlipidemia: Continue with Zetia and statin.  8.  Peripheral vascular disease: Continue with pentoxyfylline.  9.  Gastrointestinal prophylaxis: The patient is on Pepcid and Protonix.  10.  Deep vein thrombosis prophylaxis: Will have the patient on sequential compression devices and TED hose. No chemical anticoagulation due to her thrombocytopenia.    CODE STATUS: THE PATIENT IS FULL CODE.   Total time spent on admission and patient care: Fifty-five minutes.     ____________________________ Starleen Arms, MD dse:dm D: 03/30/2013 07:55:06 ET T: 03/30/2013 08:14:58 ET JOB#: [id number]  cc: Starleen Arms, MD, <Dictator> Kashton Mcartor Teena Irani MD ELECTRONICALLY SIGNED 04/08/2013 3:05

## 2014-11-17 NOTE — Discharge Summary (Signed)
PATIENT NAME:  [name] Rocha, [name] MR#:  [id number] DATE OF BIRTH:  Sep 28, 1950  DATE OF ADMISSION:  03/30/2013 DATE OF DISCHARGE:  04/01/2013  PRESENTING COMPLAINT: Hallucinations and altered mental status.   DISCHARGE DIAGNOSES: 1.  Acute metabolic encephalopathy, suspected due to alcohol abuse.  2.  Hallucinations likely alcohol related.  3.  Acute renal failure and prenatal azotemia, resolved.   CODE STATUS: Full code.   MEDICATIONS: 1.  Omeprazole 10 mg p.o. daily.  2.  Tramadol 50 mg every 4 hours.  3.  Pentoxifylline 400 mg extended-release 1 tablet t.i.d.  4.  Nitrostat 0.4 mg sublingually p.r.n.  5.  Amlodipine 10 mg daily.  6.  Atorvastatin 80 mg at bedtime.  7.  Cyclobenzaprine 10 mg at bedtime.  8.  Famotidine 20 mg b.i.d.  9.  Metoprolol 100 mg extended-release 150 mg daily.  10.  Imdur 120 mg p.o. daily.  11.  Lisinopril 40 mg daily.  12.  Plavix 75 mg daily.  13.  Budeprion SR 150 mg p.o. daily.  14.  Gabapentin 300 mg 3 times a day.  15.  Hydrochlorothiazide 25 mg daily.  16.  Zetia 10 mg daily.  17.  Chlordiazepoxide 25 mg 2 capsules every 8 hours.  18.  Lasix 40 mg daily as needed for fluid overload.  19.  Magnesium oxide 400 mg 1 tablet b.i.d.   FOLLOW UP:  With Dr. Dennison BullaVinay  Reddy at [facility] in 1 to 2 weeks.   LABORATORY DATA: At discharge  1. Glucose is 87, BUN 14, creatinine 0.7, sodium 141, potassium 3.3, chloride is 106, magnesium 1.3. Hemoglobin and hematocrit is 11.6 and 34.6. White count is 5.7, platelet count is 97.  2.  Cardiac enzymes x3 negative.  3.  Chest x-ray:  No acute disease of the chest.  4.  CT of the head: No acute intracranial abnormality.  5.  EKG shows normal sinus rhythm with incomplete left bundle branch block.  6.  LFTs are elevated SGOT at 131.   Psychiatry consultation with Dr. Garnetta BuddyFaheem.   BRIEF SUMMARY OF HOSPITAL COURSE: Ms. [name] Rocha is a 64 year old African American female with history of CAD, hyperlipidemia and PVD,  who presents with generalized weakness, presyncopal episode and was found to have hallucinations. She was admitted with:  1.  Presyncope which appeared to be due to volume depletion and dehydration. The patient was started on IV fluids. Her blood pressure stabilized and BP meds were resumed at discharge. She appears to be neurologically intact.  2.  Acute renal failure secondary to poor p.o. intake and medications along with dehydration. Her creatinine was around 2.4 on admission and came down to 0.7 at discharge after IV hydration. Her home meds were resumed.  3.  Altered mental status was secondary to acute metabolic encephalopathy in the setting of renal failure and alcohol abuse. The patient had been writing up on the board messages addressed to her daughter. She has some pressured speech had some anxiety and persistently requested to go home. There were clinically no sign or symptoms of withdrawal. She had some confusion, on and off; however, she did answer all the basic questions appropriately. She was evaluated by Dr. Garnetta BuddyFaheem from psychiatry and the patient is okay from Psych's standpoint to go home if she remained stable. She was given a tapering dose of Librium.  4.  Hypomagnesemia. Oral magnesium was prescribed.  5.  Thrombocytopenia likely alcohol related.  6.  CAD. Home meds were resumed.  7.  Hospital  stay otherwise remained stable. The patient remained a FULL CODE. The discharge plan was discussed with the patient's daughter.   TIME SPENT: 40 minutes.   ____________________________ Wylie Hail Allena Katz, MD sap:dp D: 04/02/2013 07:02:23 ET T: 04/02/2013 07:19:34 ET JOB#: [id number]  cc: Maanvi Lecompte A. Allena Katz, MD, <Dictator> Janece Canterbury, MD Willow Ora MD ELECTRONICALLY SIGNED 04/06/2013 12:13

## 2014-11-17 NOTE — Consult Note (Signed)
PATIENT NAME:  [name] BurnsHILLIPS, Keshawn MR#:  [id number] DATE OF BIRTH:  Jan 04, 1951  DATE OF CONSULTATION:  03/31/2013  REFERRING PHYSICIAN:  Krystal EatonShayiq Ahmadzia, MD CONSULTING PHYSICIAN:  Ardeen FillersUzma S. Garnetta BuddyFaheem, MD  REASON FOR CONSULTATION: Hallucinations.   HISTORY OF PRESENT ILLNESS: The patient is a 64 year old female with previous history of hypertension, coronary artery disease status post stent x 3 and hyperlipidemia who was admitted after she was feeling very weak and was unable to walk and she actually fell on the floor. She called the EMS and was brought to the hospital. The patient does not have any previous admissions to this hospital and no collateral information was obtained. However, once she was admitted to the hospital, it was noted that she was in acute renal failure and her creatinine was high at 2.5. The patient also had a code called last night as she started becoming more agitated and was noted to be hallucinating. Collateral information was obtained from her daughter at that time and it was noted that the patient has been drinking excessively while at home.   During my interview, as I entered the room, the patient was noted to be cleaning her drawers and was hallucinating, She told me that she is trying to clean everything and she came here because she fell and her legs gave way. She was noted to be rambling and was going in circles and was unable to answer any questions appropriately. She reported that she did not have any drink for the past 5 days. She drinks with her friends as well as by herself. She reported that she consumes approximately 40 ounces of hard liquor. The patient reported that she sometimes sees bugs and mosquitos coming from the walls. She is not hallucinating at this time. She reported that she has problems in her legs and she [place] Dr. [place] as well as a vascular physician. Reported that Dr. Betti Cruzeddy gave her citalopram to take as needed basis as well as bupropion to help  with her smoking cessation. She last took the citalopram 2 days ago. She takes 23 different medications and she puts them in a line and then she takes them whenever she feels like taking the medications. The patient appeared to be in a tangent and was unable to provide any meaningful history. However, she was noted to be in withdrawals at this time. She currently denied having any suicidal ideations or plans.   PAST PSYCHIATRIC HISTORY: The patient denied having any previous psychiatric admissions. She reported that she is only taking citalopram and bupropion. She reported that she has never attempted suicide.   PAST MEDICAL HISTORY: Congestive heart failure, coronary artery disease status post stent x 3, GERD, peripheral vascular disease, hypertension, hyperlipidemia, partial hysterectomy and cardiac stent x 3.   ALLERGIES: No known drug allergies. C  CURRENT HOME MEDICATIONS: 1.  Plavix 75 mg p.o. daily. 2.  Hydrochlorothiazide 25 mg p.o. daily. 3.  Tramadol 50 mg every 4 hours as needed. 4.  Lisinopril 40 mg p.o. daily. 5.  Isosorbide mononitrate 120 mg p.o. daily. 6.  Sublingual nitroglycerin as needed. 7.  Gabapentin 300 mg 3 times daily. 8.  Atorvastatin 80 mg p.o. daily. 9.  Zetia 10 mg daily. 10.  Metoprolol 100 mg 1-1/2 tablets daily. 11.  Amlodipine 10 mg daily. 12.  Lasix 40 mg daily. 13.  Famotidine 20 mg b.i.d. 14.  Pentoxifylline 400 mg 3 times daily. 15.  Cyclobenzaprine 10 mg at bedtime. 16.  Omeprazole 20 mg daily.  17.  Bupropion SR 150 mg daily.   SOCIAL HISTORY: The patient currently lives by herself. She is close to her daughter. She has been drinking alcohol on a consistent basis.   VITAL SIGNS: Temperature 98.4, pulse 81, respirations 18, blood pressure 112/58.  LABORATORY DATA: Glucose 100, BUN 17, creatinine 1.05, sodium 137, potassium 3.0, chloride 102, bicarbonate 29, anion gap 6, osmolality 275, calcium 9.3. Magnesium 1.2. Protein 7.4, albumin 4.2, bilirubin  0.5, alkaline phosphatase 108, AST 131, ALT 43. CK 146, CK-MB 0.8. Troponin 0.03. WBC 5.7, hemoglobin 11.6, hematocrit 34.6, platelet count 97, RDW 16.9.   MENTAL STATUS EXAMINATION: The patient is a thinly built female who appeared to be responding to internal stimuli at this time. She was unable to provide a meaningful history. She was in a tangent and her speech was rambling. She was able to sit down and talk for a few minutes. She denied having any suicidal ideations or plans. She denied having any thoughts to harm herself. She demonstrated poor insight and judgment.   DIAGNOSTIC IMPRESSION: AXIS I: 1.  Alcohol withdrawal delirium. 2.  Alcohol dependence.  AXIS II: None.  AXIS III: Please review the medical history.   TREATMENT PLAN: The patient will be started on Haldol 1 mg p.o. t.i.d. for the next 3 days. She will also be given Librium 50 mg 1 time dose now. She will continue on Librium 25 mg p.o. q. 6 hours. I will discontinue the bupropion as it might increase the risk of seizures in this patient who is withdrawing from alcohol at this time. The patient will be monitored closely and when she becomes clinically stable she can be discharged from the hospital. Thank you for allowing me to participate in the care of this patient.  ____________________________ Ardeen Fillers. Garnetta Buddy, MD usf:sb D: 03/31/2013 13:49:35 ET T: 03/31/2013 14:14:59 ET JOB#: [id number]  cc: Ardeen Fillers. Garnetta Buddy, MD, <Dictator> Rhunette Croft MD ELECTRONICALLY SIGNED 04/07/2013 13:23

## 2014-11-18 NOTE — Consult Note (Signed)
PATIENT NAME:  [name] Rocha, [name] MR#:  [id number] DATE OF BIRTH:  [birth year]  DATE OF CONSULTATION:  10/16/2013  CONSULTING PHYSICIAN:  [name] NancyShaukat A. Mareli Antunes, [name] Rocha  INDICATION FOR CONSULTATION: Wide complex tachycardia, elevated troponin and congestive heart failure.   HISTORY OF PRESENT ILLNESS: This is a 64 year old African American female with a past medical history of COPD, obstructive sleep apnea, coronary artery disease status post PCI and stenting x 3 normally [facility] presented to the Emergency Room with wide complex tachycardia, shortness of breath, PND, orthopnea, and leg swelling. The patient presented with extreme shortness of breath, elevated blood pressure, diaphoretic, chest pain on deep inspiration. Her troponin were elevated, thus I was asked to evaluate the patient. She was started on IV nitroglycerin and IV amiodarone and repeat EKG showed short PR interval with delta waves suggestive of WPW  PAST MEDICAL HISTORY: History of uncontrolled hypertension, hyperlipidemia, coronary artery disease status post PCI and [facility]. [facility].   SOCIAL HISTORY: She occasionally smokes 2 to 3 cigarettes a day. No EtOH abuse.   FAMILY HISTORY: Positive for coronary artery disease.   PHYSICAL EXAMINATION: GENERAL: She is diaphoretic, short of breath.  VITAL SIGNS: Blood pressure 170/100, respirations right now are at 22. She is on BiPAP. Her heart rate is about 110. She is afebrile.  NECK: Positive jugular venous distention.  LUNGS: There is diffuse rhonchi and rales and crepitations.  HEART: Tachycardic. Normal S1, S2. No audible murmur.  ABDOMEN: Soft, nontender, positive bowel sounds.  EXTREMITIES: 1+ pedal edema.   EKG initially showed wide complex tachycardia which looks like WPW with aberrancy, rate 167. Follow-up EKG after amiodarone shows normal sinus rhythm with sinus tachycardia and short PR interval, questionable  delta wave suggestive of WPW and nonspecific ST-T changes. LABORATORIES:  Troponin is 0.25. TSH is 1.24, magnesium is 1.5. BUN is 13. Creatinine is 0.99, potassium 3.4.   ASSESSMENT AND PLAN: The patient has hypomagnesemia, hypokalemia, has a history of coronary artery disease status post PCI and [facility]. Chest x-ray shows combination of pneumonia and congestive heart failure. She was given amiodarone and she has converted to sinus tachycardia Advised replacing the magnesium, and potassium. Advised giving Lasix and continuation of potassium and magnesium. I advised echocardiogram, also will set up for cardiac catheterization.    Thank you very much for referral. ____________________________ [name] NancyShaukat A. Dent Plantz, [name] Rocha sak:sg D: 10/16/2013 11:41:06 ET T: 10/16/2013 12:02:11 ET JOB#: [id number]  cc: [name] NancyShaukat A. [name] Halberstadt, [name] Rocha, <Dictator> [name] NancySHAUKAT A Raveen Wieseler [name] Rocha ELECTRONICALLY SIGNED 11/09/2013 13:58

## 2014-11-18 NOTE — Consult Note (Signed)
PATIENT NAME:  [name] Rocha, [name] MR#:  [id number] DATE OF BIRTH:  1950-09-19  DATE OF CONSULTATION:  11/29/2013  REFERRING PHYSICIAN:   CONSULTING PHYSICIAN:  Laurier NancyShaukat A. Leitha Hyppolite, MD  INDICATION FOR CONSULTATION: Elevated troponin and CHF.  HISTORY OF PRESENT ILLNESS: This is a 64 year old African American female with a past medical history of coronary artery disease, hypertension, and CHF who was recently admitted with pneumonia and came into the hospital with severe shortness of breath requiring BiPAP. She had an elevated troponin, thus I was asked to evaluate the patient. The patient states that she has been having shortness of breath, PND, and orthopnea, but no leg swelling for the past couple of weeks, and she could not do anything thus she came into the hospital.   PAST MEDICAL HISTORY: Hypertension, hyperlipidemia, sleep apnea, GERD, COPD.  SOCIAL HISTORY: She smokes 1 pack per day. No EtOH abuse.   ALLERGIES: None.  MEDICATIONS: She is noncompliant.   PHYSICAL EXAMINATION: GENERAL: She is alert and oriented x3, in mild distress due to shortness of breath. VITAL SIGNS: Blood pressure 168/92, respiration 29, pulse 104, temperature 98.3, and saturation 95.  NECK: Positive JVD, 8 cm. LUNGS: There is crepitation at the bases.  HEART: Regular rate and rhythm. Normal S1, S2. Tachycardic.  ABDOMEN: Soft, nontender. Positive bowel sounds.  EXTREMITIES: 1+ pedal edema.   DIAGNOSTIC DATA: EKG shows sinus tachycardia with nonspecific ST-T changes.   Initial troponin was 1.4 and then went up to 12 and now is 14. Magnesium was also 1.3, which is being replaced. BUN and creatinine are normal.   ASSESSMENT AND PLAN: The patient is having non-ST-segment elevation myocardial infarction, is already on heparin, Plavix, and aspirin. We will do cardiac catheterization today.   Thank you very much for the referral.   ____________________________ Laurier NancyShaukat A. Zaleah Ternes, MD sak:sb D: 11/29/2013 08:33:34  ET T: 11/29/2013 08:57:36 ET JOB#: [id number]  cc: Laurier NancyShaukat A. Areatha Kalata, MD, <Dictator> Laurier NancySHAUKAT A Livier Hendel MD ELECTRONICALLY SIGNED 12/02/2013 11:08

## 2014-11-18 NOTE — Discharge Summary (Signed)
PATIENT NAME:  [name] Rocha, BENHAM MR#:  [id number] DATE OF BIRTH:  05-01-1951  DATE OF ADMISSION:  11/28/2013 DATE OF DISCHARGE:  12/02/2013.  PRIMARY CARE PHYSICIAN:  None local.  CARDIOLOGIST:  Adrian Blackwater, MD.   FINAL DIAGNOSES:  1.  Acute respiratory failure, possibly chronic with pulse oximetry 80% on room air. Continue oxygen supplementation.  2.  Pneumonia.  3.  End ST segment elevation myocardial infarction.  4.  Acute on chronic systolic congestive heart failure.  5.  Alcohol abuse.  6.  Tobacco abuse.   MEDICATIONS ON DISCHARGE:  Include omeprazole 10 mg daily as needed for indigestion and heartburn, Nitrostat 0.4 mg sublingually every 5 minutes for 3 doses as needed for chest pain, atorvastatin 80 mg at bedtime, famotidine 20 mg twice a day, Imdur 120 mg daily, Plavix 75 mg daily, gabapentin 300 mg 3 times a day, Zetia 10 mg daily, magnesium oxide 400 mg twice a day, aspirin 81 mg daily, Wellbutrin extended release 150 mg daily, vitamin D 400 international units 3 times a week, lisinopril 5 mg p.o. daily, carvedilol 6.25 mg p.o. twice a day, Spiriva 1 inhalation daily, Symbicort 160/4.5, 1 inhalation twice a day, amiodarone 200 mg daily, Librium 25 mg 1 capsule every 8 hours for 3 days, 1 capsule twice a day for 3 days, then 1 capsule for 5 days, then stop, potassium chloride 20 mEq daily, Levaquin 750 mg once a day for 4 more days, nicotine patch 14 mg patch once a day, oxycodone 1 tablet every 8 hours as needed for pain, oxygen 2 L nasal cannula.   DIET:  Low sodium diet.   ACTIVITY:  As tolerated.   FOLLOWUP:  Dr. Welton Flakes, Cardiology, in 1 week, 1 to 2 days with doctor at rehab.   The patient was admitted 11/28/2013 and discharged 12/02/2013. The patient came in with shortness of breath, cough, sputum, fever for 3 days. Admitted with acute respiratory failure, pneumonia, non-ST-segment elevation myocardial infarction, lactic acidosis, hypokalemia.   LABORATORY AND RADIOLOGICAL DATA  DURING THE HOSPITAL COURSE: Magnesium 1.3. Troponin 1.3. Glucose 57, BUN 8, creatinine 0.95, sodium 147, potassium 3.2, chloride 108, CO2 of 15, calcium 9.6, total bilirubin 1.3, alkaline phosphatase 246, ALT 75, AST 238, total protein 8.5. White blood cell count 7.2, H and H 14.5 and 48.1, platelet count of 119. EKG shows sinus tachycardia, premature ventricular complexes, septal infarct. ABG showed a pH of 7.15, pCO2 of 32, pO2 105. That was on BiPAP. Chest x-ray showed worsening bilateral aeration, diffuse interstitial airspace disease. Blood cultures negative. Troponin went up to 12 then the next troponin up to 14. LDL 121, HDL 145, triglycerides 82. Cardiac cath EF15%. Medical management advised on cardiac catheterization report. Echo showed EF of less than 20%, severely dilated atrium, moderate mitral valve regurgitation, moderate to severe tricuspid regurgitation, mildly elevated pulmonary arterial systolic pressure. Creatinine upon discharge 0.87, magnesium 1.6, potassium 3.5.   HOSPITAL COURSE PER PROBLEM LIST:  1.  For the patient's acute respiratory failure, initially requiring BiPAP, now on 2 L nasal cannula. When we checked her room air saturation on 11/30/2013, it dropped down to 80%. We will give oxygen supplementation at rehab.  2.  Pneumonia. Will complete a course of Levaquin.  3.  Non-ST elevation myocardial infarction. The patient had a cardiac cath. Medical management recommended. The patient is on Nitrostat, Imdur, atorvastatin, high dose aspirin, Plavix and Coreg. Medical management recommended by Dr. Welton Flakes.  4.  Acute on chronic systolic congestive heart failure, EF  10%, medical management needed. The patient's lungs upon discharge are clear. The patient is on Lasix, Coreg, lisinopril. The patient does have a life vest, which is not here at the hospital. The daughter is hopefully going to bring that in. The patient will have to wear a life vest 24/7, can take off for showers.  5.   Alcohol abuse. We will put on Librium taper.  6.  Tobacco abuse. Nicotine patch prescribed.  TIME SPENT ON DISCHARGE:  40 minutes. Overall prognosis poor secondary to severe cardiomyopathy.   CODE STATUS:  THE PATIENT IS A FULL CODE.   ____________________________ Herschell Dimesichard J. Renae GlossWieting, MD rjw:jm D: 12/02/2013 09:59:49 ET T: 12/02/2013 10:23:22 ET JOB#: [id number]  cc: Herschell Dimesichard J. Renae GlossWieting, MD, <Dictator> Laurier NancyShaukat A. Khan, MD Salley ScarletICHARD J Rebeca Valdivia MD ELECTRONICALLY SIGNED 12/04/2013 14:49

## 2014-11-18 NOTE — Discharge Summary (Signed)
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    Chief Complaint/History of Present Illness 64 year old female with a history of coronary artery disease status post three stents, hypertension, congestive heart failure, chronic obstructive pulmonary disease, sleep apnea, tobacco dependence, who presents with the above complaint. The patient says since that night. She has had increasing shortness of breath, lower extremity edema, weight gain, PND, orthopnea, and wheezing. She also had frothy sputum. Chest x-ray today in the ER is consistent with pulmonary edema with some possible underlying pneumonia. BNP is elevated. Upon arrival to the ER she had a EKG which showed a narrow complex tachycardia with heart rates in 160s. I have asked Dr. Humphrey Rolls to see the patient here in the ER. 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 She actually was started on amiodarone after discussion with Dr. Benjaman Lobe. We  felt this would be a good medication to use for her narrow complex tachycardia and now she has converted to normal sinus rhythm. Her blood pressure upon arrival was 240/149. She is on nitroglycerin. Her blood pressure systolic is in the 045W now. She is also complaining of some chest tightness without radiation but associated with palpitations. Her troponin is 0.25.   Allergies:  No Known Allergies:   Thyroid:  22-Mar-15 10:17   Thyroid Stimulating Hormone 1.24 (0.45-4.50 (International Unit)  ----------------------- Pregnant patients have  different reference  ranges for TSH:  - - - - - - - - - -  Pregnant, first trimetser:  0.36 - 2.50 uIU/mL)  Hepatic:  22-Mar-15 10:17    Bilirubin, Total  1.7  Alkaline Phosphatase  247 (45-117 NOTE: New Reference Range 06/17/13)  SGPT (ALT) 52  SGOT (AST)  63  Total Protein, Serum 8.1  Albumin, Serum 3.7  Lab:  22-Mar-15 10:20   pH (ABG)  7.18  PCO2 35  PO2 95  Base Excess  -14.3  HCO3  13.1  O2 Saturation 96.1  O2 Device BIPAP  Specimen Site (ABG) LT RADIAL  Specimen Type (ABG) ARTERIAL  Patient Temp (ABG) 37.0  PEEP 8.0  FiO2 100  PSV 14  Routine Chem:  22-Mar-15 10:17   Glucose, Serum  108  BUN 13  Creatinine (comp) 0.99  Sodium, Serum 144  Potassium, Serum  3.4  Chloride, Serum  113  CO2, Serum  15  Calcium (Total), Serum  8.2  Anion Gap 16  Osmolality (calc) 287  eGFR (African American) >60  eGFR (Non-African American) >60 (eGFR values <54m/min/1.73 m2 may be an indication of chronic kidney disease (CKD). Calculated eGFR is useful in patients with stable renal function. The eGFR calculation will not be reliable in acutely ill patients when serum creatinine is changing rapidly. It is not useful in  patients on dialysis. The eGFR calculation may not be applicable to patients at the low and high extremes of body sizes, pregnant women, and vegetarians.)  Magnesium, Serum  1.5 (1.8-2.4 THERAPEUTIC RANGE: 4-7 mg/dL TOXIC: > 10 mg/dL  -----------------------)  Result Comment TROPONIN - RESULTS VERIFIED BY REPEAT TESTING.  - C/JEAN ADAMS AT 1117 10/16/13-DAS  - READ-BACK PROCESS PERFORMED.  Result(s) reported on 16 Oct 2013 at 11:20AM.  B-Type Natriuretic Peptide ([facility]  [phone](Result(s) reported on  16 Oct 2013 at 11:13AM.)    10:20   Result Comment - HAND DELIVERED  - Dr. Benjaman Lobe 10/16/13 1025  Result(s) reported on 16 Oct 2013 at 01:05PM.  23-Mar-15 03:12   Creatinine (comp) 1.12  24-Mar-15 05:11   Creatinine (comp)  1.51    12:31   Creatinine (comp)  1.63  25-Mar-15 05:15   Creatinine (comp)  1.59    13:31   Creatinine (comp)  1.67  26-Mar-15 04:38   Creatinine (comp)  1.55   Cardiac:  22-Mar-15 10:17   CPK-MB, Serum  3.7 (Result(s) reported on 16 Oct 2013 at 11:13AM.)  Troponin I  0.25 (0.00-0.05 0.05 ng/mL or less: NEGATIVE  Repeat testing in 3-6 hrs  if clinically indicated. >0.05 ng/mL: POTENTIAL  MYOCARDIAL INJURY. Repeat  testing in 3-6 hrs if  clinically indicated. NOTE: An increase or decrease  of 30% or more on serial  testing suggests a  clinically important change)  CK, Total 171 (26-192 NOTE: NEW REFERENCE RANGE  08/29/2013)  23-Mar-15 03:12   Troponin I  0.47 (0.00-0.05 0.05 ng/mL or less: NEGATIVE  Repeat testing in 3-6 hrs  if clinically indicated. >0.05 ng/mL: POTENTIAL  MYOCARDIAL INJURY. Repeat  testing in 3-6 hrs if  clinically indicated. NOTE: An increase or decrease  of 30% or more on serial  testing suggests a  clinically important change)    08:26   Troponin I  0.40 (0.00-0.05 0.05 ng/mL or less: NEGATIVE  Repeat testing in 3-6 hrs  if clinically indicated. >0.05 ng/mL: POTENTIAL  MYOCARDIAL INJURY. Repeat  testing in 3-6 hrs if  clinically indicated. NOTE: An increase or decrease  of 30% or more on serial  testing suggests a  clinically important change)    12:10   Troponin I  0.32 (0.00-0.05 0.05 ng/mL or less: NEGATIVE  Repeat testing in 3-6 hrs  if clinically indicated. >0.05 ng/mL: POTENTIAL  MYOCARDIAL INJURY. Repeat  testing in 3-6 hrs if  clinically indicated. NOTE: An increase or decrease  of 30% or more on serial  testing suggests a  clinically important change)  Routine UA:  22-Mar-15 10:17   Color (UA) Yellow  Clarity (UA) Clear  Glucose (UA) 50 mg/dL  Ketones (UA) Negative  Specific Gravity (UA) 1.017  Blood (UA) Negative  pH (UA) 5.0  Protein (UA) 100 mg/dL  Nitrite (UA) Negative  Leukocyte Esterase (UA) Negative (Result(s) reported on 16 Oct 2013 at 10:50AM.)  RBC (UA) 2 /HPF  WBC (UA) 2 /HPF  Bacteria (UA) NONE SEEN  Epithelial Cells (UA) NONE SEEN  Mucous (UA) PRESENT   Hyaline Cast (UA) 5 /LPF (Result(s) reported on 16 Oct 2013 at 10:50AM.)  Routine Coag:  22-Mar-15 10:17   Prothrombin  16.0  INR 1.3 (INR reference interval applies to patients on anticoagulant therapy. A single INR therapeutic range for coumarins is not optimal for all indications; however, the suggested range for most indications is 2.0 - 3.0. Exceptions to the INR Reference Range may include: Prosthetic heart valves, acute myocardial infarction, prevention of myocardial infarction, and combinations of aspirin and anticoagulant. The need for a higher or lower target INR must be assessed individually. Reference: The Pharmacology and Management of the Vitamin K  antagonists: the seventh ACCP Conference on Antithrombotic and Thrombolytic Therapy. IRCVE.9381 Sept:126 (3suppl): [id number]. A HCT value >55% may artifactually increase the PT.  In one study,  the increase was an average of 25%. Reference:  "Effect on Routine and Special Coagulation Testing Values of Citrate Anticoagulant  Adjustment in Patients with High HCT Values." American Journal of Clinical Pathology 2006;126:400-405.)  Routine Hem:  22-Mar-15 10:17   WBC (CBC) 9.8  RBC (CBC) 5.05  Hemoglobin (CBC) 12.8  Hematocrit (CBC) 41.2  Platelet Count (CBC) 225 (Result(s) reported on 16 Oct 2013 at 10:37AM.)  MCV 82  MCH  25.3  MCHC  31.0  RDW  23.0  23-Mar-15 03:12   WBC (CBC)  20.8  24-Mar-15 05:11   WBC (CBC)  16.8  25-Mar-15 05:15   WBC (CBC) 10.3   PERTINENT RADIOLOGY STUDIES: XRay:    22-Mar-15 10:25, Chest Portable Single View  Chest Portable Single View   REASON FOR EXAM:    sob  COMMENTS:       PROCEDURE: DXR - DXR PORTABLE CHEST SINGLE VIEW  - Oct 16 2013 10:25AM     CLINICAL DATA:  Acute respiratory distress.    EXAM:  PORTABLE CHEST - 1 VIEW    COMPARISON:  March 30, 2013.    FINDINGS:  Cardiomediastinal silhouette is increased in size suggesting  possible pericardial effusion. Diffuse  airspace opacity is noted in  the right lung consistent with pneumonia. Left basilar opacity is  noted concerning for possible pneumonia. No pneumothorax is noted.  Bony thorax is intact.     IMPRESSION:  Cardiac silhouette is enlarged compared to prior exam concerning for  underlying pericardial effusion. Large airspace opacity is seen  involving the right upper in lower lobes most consistent with  pneumonia or less likely edema. Left basilar opacity is noted also  concerning for pneumonia.      Electronically Signed    By: Sabino Dick M.D.    On: 10/16/2013 10:33     Verified By: Marveen Reeks, M.D.,    23-Mar-15 09:01, Chest Portable Single View  Chest Portable Single View   REASON FOR EXAM:    PNA  COMMENTS:       PROCEDURE: DXR - DXR PORTABLE CHEST SINGLE VIEW  - Oct 17 2013  9:01AM     CLINICAL DATA:  Pneumonia, history of CHF and renal failure    EXAM:  PORTABLE CHEST - 1 VIEW    COMPARISON:  DG CHEST 1V PORT dated 10/16/2013    FINDINGS:  Since the previous study there has been considerable improvement in  the appearance of the right lung. The confluent alveolar densities  have largely resolved, but considerable increased interstitial  density persists throughoutthe right lung. On the left minimal  prominence of the interstitial markings is noted. The hemidiaphragm  and heart border are better demonstrated. The pulmonary vascularity  is more distinct. The trachea is midline. The cardiopericardial  silhouetteremains enlarged. The observed portions of the bony  thorax exhibit no acute abnormalities.     IMPRESSION:  The findings are consistent with improving alveolar filling  processes on the right which may reflect pneumonia or CHF. Given the  dramatic change since yesterday's study there was likely a large  component of interstitial edema present which has now decreased.  When the patient can tolerate the procedure, a PA and lateral chest  x-ray would be of  value.  Electronically Signed    By: David Martinique    On: 10/17/2013 09:10         Verified By: DAVID A. Martinique, M.D., MD   Pertinent Past History:  Pertinent Past History 1. History of coronary artery disease status post three stents.  2. History of congestive heart failure unknown ejection fraction.  3.  GERD.  4. Peripheral vascular disease. 5. Hypertension.  6. Hyperlipidemia.  7. Obstructive sleep apnea. She says she wears a CPAP machine at night.  8. History of COPD.   Hospital Course:  Hospital Course 1. Ac respiratory failure due to pneumonia, systolic CHf, continue Ab, taper steroids, better, contineu lasix once daily, following ins/outs, following renal function. on room air at discharge 2. acute on chronic systolic CHF, echo  EF <73%, contineu  lasix, some better , holding ACEI due to renal failure 3. COPD exacerbation, Inhalers ,initially on OIV then- taper steroids, nebs, improved 4. bacterial pneumonia, get sputum cx, continue doxy po, dc rocephin, better, recent chest xray showed improving CHF 5. malignant HTn, off nitro 2/2 HA , ACEI on hold, contineu coreg, hydralazine, imdur- under control later. 6 cardiomyoapthy, EF<20, s/p cardiac cath 3.24.15 by DR Humphrey Rolls, mid LAD 50% stenosis, mid Lcx, 50%, mid RCA 100 % with good collaterals from Lcx and LAD, severe LV dysfuction, contine coreg, hydralazine/imdur- follow with cardiology clinic. 6.  NQWMI, heparin,coreg, asa,  Ce times 3 showed minimal increase , possibly tachycardia related 7 WPW, continue  amiodarone, coreg, stable in NSR now. 8 ac renal failrue, unable to give IVF due to CHF, resumed lasix, Creatinin slightly better- spoke to her- to follow with her PMD in 1-2 weeks to check renal function.   Condition on Discharge Stable   Code Status:  Code Status Full Code   DISCHARGE INSTRUCTIONS HOME MEDS:  Medication Reconciliation: Patient's Home Medications at Discharge:     Medication Instructions  omeprazole  10 mg oral delayed release capsule  1 cap(s) orally once a day, As Needed - for Indigestion, Heartburn   nitrostat 0.4 mg sublingual tablet  1 tab(s) sublingual every 5 minutes up to 3 doses as needed for chest pain. *if no relief call md or go to emergency room*   atorvastatin 80 mg oral tablet  1 tab(s) orally once a day (at bedtime)   famotidine 20 mg oral tablet  1 tab(s) orally 2 times a day   isosorbide mononitrate 120 mg oral tablet, extended release  1 tab(s) orally once a day (in the morning) as needed for heart   clopidogrel 75 mg oral tablet  1 tab(s) orally once a day, As Needed   gabapentin 300 mg oral capsule  1 cap(s) orally 3 times a day   zetia 10 mg oral tablet  1 tab(s) orally once a day   chlordiazepoxide 25 mg oral capsule  2 caps (59m) orally every 8 hours   magnesium oxide 400 mg (241.3 mg elemental magnesium) oral tablet  1 tab(s) orally 2 times a day   aspirin enteric coated 81 mg oral delayed release tablet  1 tab(s) orally once a day   budeprion sr 150 mg/12 hours oral tablet, extended release  1 tab(s) orally once a day   vitamin c 1000 mg oral tablet  1 tab(s) orally 3 times a week   vitamin e  1 cap(s) orally 3 times a week   vitamin d3 400 intl units oral tablet  1 tab(s) orally 3 times a week   lisinopril 5 mg oral tablet  1 tab(s) orally once a day   carvedilol 6.25 mg oral tablet  1 tab(s) orally 2 times a day   tiotropium 18 mcg inhalation capsule  1 cap(s) inhaled once a day   budesonide-formoterol 160 mcg-4.5 mcg/inh inhalation aerosol  1 puff(s) inhaled 2 times a day   furosemide 20 mg  oral tablet  1 tab(s) orally once a day   prednisone 10 mg oral tablet  Start at 60 mg and taper by 10 mg daily until complete   amiodarone 200 mg oral tablet  1 tab(s) orally once a day   cephalexin 250 mg oral tablet  1 tab(s) orally 4 times a day x 2 days    STOP TAKING THE FOLLOWING MEDICATION(S):    pentoxifylline 400 mg oral tablet, extended release: 1 tab(s)  orally 3 times a day, As Needed amlodipine 10 mg oral tablet: 1 tab(s) orally once a day hydrochlorothiazide 25 mg oral tablet: 1 tab(s) orally once a day cyclobenzaprine 10 mg oral tablet: 1 tab(s) orally once a day (at bedtime) as needed for muscle spasms. metoprolol succinate 100 mg oral tablet, extended release: 1.5 tabs (156m) orally once a day  Physician's Instructions:  Home Health? Yes   Home Health Service Nurse  social worker   Diet Low Sodium  Low Fat, Low Cholesterol   Activity Limitations As tolerated   Return to Work Not Applicable   Time frame for Follow Up Appointment 1-2 weeks  Dr. kHumphrey Rolls  Other Comments cardiology clinic in 1 week. Follow with PMD in 8-10 days to check kidney function.  CC a Copy to Dr. kHumphrey RollsOffice.   Electronic Signatures: VVaughan Basta(MD)  (Signed 30-Mar-15 22:08)  Authored: ADMISSION DATE AND DIAGNOSIS, CHIEF COMPLAINT/HPI, Allergies, PERTINENT LABS, PERTINENT RADIOLOGY STUDIES, PERTINENT PAST HISTORY, HOSPITAL COURSE, DISCHARGE INSTRUCTIONS HOME MEDS, PATIENT INSTRUCTIONS   Last Updated: 30-Mar-15 22:08 by VVaughan Basta(MD)

## 2014-11-18 NOTE — H&P (Signed)
PATIENT NAME:  [name] Rocha, [name] Rocha MR#:  [id number] DATE OF BIRTH:  [birth year]  DATE OF ADMISSION:  10/16/2013  ADDENDUM: The patient does smoke 1 to 2 cigarettes a day. She does not want to quit. She has cut down dramatically. The patient was counseled for 3-1/2 minutes regarding smoking cessation.  She does not want a nicotine patch, as well.    ____________________________ Ashantee Deupree P. Juliene PinaMody, MD spm:sg D: 10/16/2013 11:45:21 ET T: 10/16/2013 14:26:50 ET JOB#: [id number]  cc: Mung Rinker P. Juliene PinaMody, MD, <Dictator> Janyth ContesSITAL P Keary Hanak MD ELECTRONICALLY SIGNED 10/16/2013 14:45

## 2014-11-18 NOTE — H&P (Signed)
PATIENT NAME:  [name] Rocha, [name] MR#:  [id number] DATE OF BIRTH:  [birth year]  DATE OF ADMISSION:  11/28/2013  PRIMARY CARE PHYSICIAN: None local.  REFERRING PHYSICIAN: Charlestine NightPhillip A. Scotty CourtStafford, MD  CHIEF COMPLAINT: Shortness of breath, cough, sputum, fever, chills for 3 days.   HISTORY OF PRESENT ILLNESS: This is a 64 year old African American female with a history of CAD, hypertension, CHF, recent pneumonia 2 months ago, presented to the ED with shortness of breath for 3 days. The patient is awake, alert, oriented, on BiPAP. The patient said that she started to have shortness of breath, cough with sputum 3 days ago. In addition, the patient has fever, chills, but the patient denies any chest pain or leg edema. No orthopnea or nocturnal dyspnea. The patient's O2 saturation was 72% in the ED, was placed on BiPAP. In addition, the patient's troponin is elevated at 1.3, is placed on heparin drip.   PAST MEDICAL HISTORY: Pneumonia 2 months ago, CAD status post 3 stents, CHF with ejection fraction 20%, PVD, GERD, hypertension, hyperlipidemia, obstructive sleep apnea, CPAP at night, history of COPD.   PAST SURGICAL HISTORY: Partial hysterectomy, cardiac stent.   FAMILY HISTORY: CAD.  SOCIAL HISTORY: Smoking many years, 1 to 2 packs a day. No alcohol drinking or illicit drugs.   ALLERGIES: No.   MEDICATIONS: Medication reconciliation list is not done yet. Need to follow up.   REVIEW OF SYSTEMS:    CONSTITUTIONAL: The patient has fever, chills, headache, dizziness, and weakness.  EYES: No double vision or blurred vision.  EAR, NOSE, THROAT: No postnasal drip, slurred speech or dysphagia.  CARDIOVASCULAR: No chest pain, palpitation, orthopnea, nocturnal dyspnea. No leg edema.  PULMONARY: Positive for cough, productive sputum, and shortness of breath. No wheezing or hemoptysis.  GASTROINTESTINAL: No abdominal pain, nausea, vomiting, diarrhea. No melena or bloody stool.  GENITOURINARY: No dysuria,  hematuria or incontinence.  SKIN: No rash or jaundice.  NEUROLOGIC: No syncope, loss of consciousness or seizure.  HEMATOLOGIC: No easy bleeding or bleeding.  ENDOCRINE: No polyuria, polydipsia, heat or cold intolerance.   PHYSICAL EXAMINATION: VITAL SIGNS: Temperature 96.9. Blood pressure was 216/113. Heart rate was 128. Blood pressure decreased to 167/87. Pulse decreased to 108. Oxygen saturation 99% on BiPAP. Respirations 24.  GENERAL: The patient is alert, awake, oriented, on BiPAP.  HEENT: Pupils round, equal, reactive to light and accommodation. Moist oral mucosa. NECK: Supple. No JVD or carotid bruit. No lymphadenopathy. No thyromegaly.  CARDIOVASCULAR: S1, S2, tachycardia, regular rhythm without murmurs or gallops.  PULMONARY: Bilateral air entry. Bilateral severe crackles. No wheezing. Mild use of accessory muscles to breathe.  ABDOMEN: Soft. No distention. No tenderness. No organomegaly. Bowel sounds present.  EXTREMITIES: No edema, clubbing or cyanosis. No calf tenderness. Bilateral pedal pulses present.  SKIN: No rash or jaundice.  NEUROLOGIC: A and O x 3. No focal deficits. Power 5/5. Sensation intact.   LABORATORY, DIAGNOSTIC, AND RADIOLOGICAL DATA: Chest x-ray: Worsening bilateral aeration with relatively diffuse interstitial airspace disease, favor multifocal infection or aspiration.   ABG showed pH of 7.15, pCO2 of 32, pO2 of 105, with FiO2 of 100%. Lactic acid is 13.5. WBC 7.2, hemoglobin 14.5, platelets 119. Glucose 57, BUN 8, creatinine 0.95, sodium 147, potassium 3.2, chloride 108, bicarbonate 15, SGOT 238, SGPT 75. Troponin 1.3, CK-MB 32.2.   EKG showed sinus tachycardia at 133 BPM with PVC.   IMPRESSIONS: 1.  Acute respiratory failure.  2.  Pneumonia.  3.  Non-ST segment elevation myocardial infarction, possible demand ischemia.  4.  Lactic acidosis.  5.  Hypokalemia.  6.  Coronary artery disease.  7.  History of chronic congestive heart failure, systolic  dysfunction, 20% ejection fraction.  8.  Chronic obstructive pulmonary disease.  9.  Thrombocytopenia.  10.  Tobacco abuse.   PLAN OF TREATMENT: 1.  The patient will be admitted to CCU. Continue BiPAP. We will continue Levaquin IVPB. Give nebulizer treatment. Follow up blood culture, CBC. Pulmonary consult.  2.  For non-STEMI, possible demand ischemia, we will follow up troponin level. Continue heparin drip. Get a cardiology consult from Dr. Welton Flakes.  3.  For hypokalemia, we will give potassium supplement and follow up BMP and magnesium level.  4.  For history of CAD, we will continue aspirin, Plavix, statin, Coreg, lisinopril. Give hydralazine p.r.n.  5.  For malignant hypertension, treatment as mentioned above. 6.  For tobacco abuse, smoking cessation was counseled for 3 minutes. We will give a nicotine patch.  7.  I discussed the patient's critical condition and plan of treatment with the patient. The patient wants full code.   TIME SPENT: About 66 minutes.    ____________________________ Shaune Pollack, MD qc:jcm D: 11/28/2013 17:19:40 ET T: 11/28/2013 18:53:51 ET JOB#: [id number]  cc: Shaune Pollack, MD, <Dictator> Shaune Pollack MD ELECTRONICALLY SIGNED 11/28/2013 21:48

## 2014-11-18 NOTE — H&P (Signed)
PATIENT NAME:  [name] Rocha, [name] Rocha MR#:  [id number] DATE OF BIRTH:  1951-06-24  DATE OF ADMISSION:  10/16/2013  PRIMARY CARE PHYSICIAN: [facility].   PRIMARY CARDIOLOGIST: [facility].   CHIEF COMPLAINT: Shortness of breath, tightness over chest and palpitations.   HISTORY OF PRESENT ILLNESS: This is a very pleasant 64 year old female with a history of coronary artery disease status post three stents, hypertension, congestive heart failure, chronic obstructive pulmonary disease, sleep apnea, tobacco dependence, who presents with the above complaint. The patient says since that night. She has had increasing shortness of breath, lower extremity edema, weight gain, PND, orthopnea, and wheezing. She also had frothy sputum. Chest x-ray today in the ER is consistent with pulmonary edema with some possible underlying pneumonia. BNP is elevated. Upon arrival to the ER she had a EKG which showed a narrow complex tachycardia with heart rates in 160s. I have asked Dr. Welton Flakes to see the patient here in the ER. 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 She actually was started on amiodarone after discussion with Dr. Mindi Junker. We  felt this would be a good medication to use for her narrow complex tachycardia and now she has converted to normal sinus rhythm. Her blood pressure upon arrival was 240/149. She is on nitroglycerin. Her blood pressure systolic is in the 180s now. She is also complaining of some chest tightness without radiation but associated with palpitations. Her troponin is 0.25.   REVIEW OF SYSTEMS:  CONSTITUTIONAL: Positive diaphoresis. No fevers. Positive fatigue and weakness for the past few days. Positive weight gain.  EYES: No blurred or double vision.  EARS, NOSE AND THROAT: Positive snoring. Positive mild hearing loss. No epistaxis.  RESPIRATORY: Positive cough, positive wheezing. Positive history of COPD, positive dyspnea.  CARDIOVASCULAR: Positive chest pain. Positive orthopnea. Positive PND.  Positive palpitations. No syncope. Positive edema. Positive dyspnea.  GASTROINTESTINAL: Positive nausea. No vomiting, diarrhea. No abdominal pain, melena, or ulcers. GENITOURINARY: No dysuria, hematuria. ENDOCRINE: No polyuria or polydipsia.  HEMATOLOGIC AND LYMPHATIC: Positive easy bruising, no bleeding.  SKIN: No rash or lesions. MUSCULOSKELETAL: No  redness or gout.  NEUROLOGIC: No history of CVA, TIA or seizures.   PSYCHIATRIC: No history of bipolar or ADD.   PAST MEDICAL HISTORY: 1. History of coronary artery disease status post three stents.  2. History of congestive heart failure unknown ejection fraction.  3. GERD.  4. Peripheral vascular disease. 5. Hypertension.  6. Hyperlipidemia.  7. Obstructive sleep apnea. She says she wears a CPAP machine at night.  8. History of COPD.   PAST SURGICAL HISTORY: 1. Partial hysterectomy.  2. Cardiac stents.   ALLERGIES: No known drug allergies.   MEDICATIONS: 1. Omeprazole 10 mg daily.  2. Tramadol 50 mg q.4 hours p.r.n.  3. Pentoxifylline p.o. 400 mg t.i.d.  4. Nitroglycerin sublingual p.r.n. chest pain.  5. Norvasc 10 mg daily.  6. Atorvastatin 80 mg at bedtime.  7. Flexeril 10 mg at bedtime.  8. Famotidine 20 mg b.i.d.  9. Metoprolol 150 mg daily.  10. Imdur 120 mg daily.  11. Lisinopril 40 mg daily.  12. Plavix 75 mg daily.  13. Budeprion SR 150 mg daily.  14. Gabapentin 300 mg t.i.d.  15. HCTZ 25 mg daily.  16. Zetia 10 mg daily.  17. Chlordiazepoxide 25 mg 2 tablets q.8 hours.  18. Lasix 40 mg p.r.n. fluid overload.  19. Magnesium oxide 400 mg b.i.d.   SOCIAL HISTORY: The patient says that she smokes one cigarette a day.  No alcohol or IV drug use.   FAMILY HISTORY: Positive for coronary artery disease.   PHYSICAL EXAMINATION:  VITAL SIGNS: The patient is afebrile, pulse 163, respirations 26, blood pressure 134/110 and 98% on this BiPAP.  GENERAL: The patient is on a BiPAP machine, sitting up, in moderate  distress.  HEENT: Head is atraumatic. Pupils are round. Sclerae are anicteric. I did not inspect oropharynx, as the patient has a BiPAP machine.  NECK: Positive for JVD. No enlarged thyroid.  CARDIOVASCULAR: Tachycardia regular irregular. No murmurs, gallops, or rubs. PMI is hard to palpate.  LUNGS: Diffuse wheezing, as well as crackles throughout the whole lung field. No dullness to percussion. No egophony. Normal chest expansion.  ABDOMEN: Obese. Bowel sounds are positive. Nontender. Hard appreciate organomegaly due to body habitus. No fluid wave.  EXTREMITIES: 1+ edema bilaterally, left has actually more edema than the right.  NEUROLOGICAL: Cranial nerves II through XII are grossly intact. There are no focal deficits.  SKIN: Without rash or lesions.   LABORATORY, DIAGNOSTIC AND RADIOLOGICAL DATA: Sodium 144, potassium 3.4, chloride 113, bicarbonate 15, BUN 13, creatinine 0.99. Glucose 108, calcium 8.2, bilirubin is 1.7, alkaline phosphatase 247, ALT 52, AST 63, total protein 8.1, albumin 3.7, troponin is 0.25. CK 171. CPK-MB 3.7. BNP 12727,  pH 7.29, pCO2 of 37, pO2 116, this is 100% of BiPAP. Lactic acid is 5.8. White blood cells 9.8, hemoglobin 12.8, hematocrit 42, platelets are 225. INR is 1.3. TSH is 1.24. Urinalysis shows no LCE or nitrites.   Chest x-ray is consistent with pulmonary edema with possible underlying pneumonia.   EKG: Initial shows neuro complex tachycardia with heart rates in 150s. Now the patient is back in sinus rhythm with biatrial enlargement on amiodarone.   ASSESSMENT AND PLAN: A 64 year old female with a history of congestive heart failure, coronary artery disease with three stents, chronic obstructive pulmonary disease, obstructive sleep apnea on CPAP machine who presents with chest pain and narrow complex tachycardia.  1. Narrow complex tachycardia. I spoke with Dr. Welton FlakesKhan, who kindly saw the patient here in the ER. The patient had already been placed on an amiodarone  drip. Dr. 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 She has some delta waves with a short PR interval. She is now converted to normal sinus rhythm, but he recommends continuing her on amiodarone through the night and observing her rhythm. We will order an echocardiogram and check a magnesium level and potassium level. He would like to replete both of these.  2. Malignant hypertension: Her blood pressure upon arrival was greater than 200. She is now on  nitroglycerin drip. We will continue her outpatient medications, monitor the blood pressure closely. I suspect this blood pressure has something to do with her congestive heart failure.  3. Acute respiratory failure secondary to acute congestive heart failure with community-acquired pneumonia and chronic obstructive pulmonary disease exacerbation. The patient will be treated for all of these things as outlined below.  4. Acute congestive heart failure. Unknown what her ejection fraction is. I have ordered echocardiogram. The patient is on a BiPAP machine. She will also be on Lasix. We will monitor I's and O's, daily weights.  5. Metabolic acidosis, likely secondary to the lactic acidosis. Her lactic acid was 5.8. I suspect this is from sepsis, possibly from her pneumonia. We will need to monitor closely.  6. Acute chronic obstructive pulmonary disease exacerbation. We will provide IV steroids, BiPAP machine, inhalers and antibiotics.  7. Community-acquired pneumonia. The  patient is on ceftriaxone as well as doxycycline. Cannot give azithromycin or Levaquin due to the fact that the patient is currently on amiodarone drip. Blood cultures have been ordered in the ER. We will continue to monitor closely.  8. Non-ST elevation myocardial infarction. The patient has elevated troponin, as well as an elevated CK-MB. We will place the patient on full dose Lovenox. Plan, if she is stable for cardiac catheterization tomorrow.  CODE STATUS: THE PATIENT IS A FULL  CODE.   The patient is critically ill. The patient will be admitted to the Critical Care Unit.   CRITICAL CARE TIME SPENT: Approximately 60 minutes.     ____________________________ Janyth Contes. Juliene Pina, MD spm:sg D: 10/16/2013 11:43:31 ET T: 10/16/2013 13:58:02 ET JOB#: [id number]  cc: Stormee Duda P. Juliene Pina, MD, <Dictator> Janece Canterbury, MD  Janyth Contes Brielynn Sekula MD ELECTRONICALLY SIGNED 10/16/2013 14:45
